# Patient Record
Sex: Female | Born: 1955 | Race: White | Hispanic: No | Marital: Married | Smoking: Never smoker
Health system: Southern US, Community
[De-identification: ages and names within clinical notes are randomized; demographics above are authoritative.]

## PROBLEM LIST (undated history)

## (undated) DIAGNOSIS — E663 Overweight: Secondary | ICD-10-CM

## (undated) HISTORY — DX: Overweight: E66.3

## (undated) HISTORY — PX: APPENDECTOMY: SHX54

---

## 2012-02-26 DEATH — deceased

## 2018-02-14 LAB — HM COLONOSCOPY

## 2019-09-09 LAB — LIPID PANEL
Cholesterol: 181 (ref 0–200)
HDL: 71 — AB (ref 35–70)
LDL Cholesterol: 80
LDl/HDL Ratio: 2.5
Triglycerides: 145 (ref 40–160)

## 2019-09-09 LAB — BASIC METABOLIC PANEL: Glucose: 82

## 2019-11-17 ENCOUNTER — Other Ambulatory Visit: Payer: Self-pay

## 2019-11-20 ENCOUNTER — Ambulatory Visit (INDEPENDENT_AMBULATORY_CARE_PROVIDER_SITE_OTHER): Payer: Commercial Managed Care - PPO | Admitting: Family Medicine

## 2019-11-20 ENCOUNTER — Other Ambulatory Visit: Payer: Self-pay

## 2019-11-20 ENCOUNTER — Encounter: Payer: Self-pay | Admitting: Family Medicine

## 2019-11-20 VITALS — BP 124/70 | HR 83 | Temp 97.5°F | Ht 67.0 in | Wt 183.4 lb

## 2019-11-20 DIAGNOSIS — E663 Overweight: Secondary | ICD-10-CM | POA: Diagnosis not present

## 2019-11-20 DIAGNOSIS — Z1231 Encounter for screening mammogram for malignant neoplasm of breast: Secondary | ICD-10-CM | POA: Diagnosis not present

## 2019-11-20 NOTE — Patient Instructions (Addendum)
Health Maintenance Due  Topic Date Due  . Hepatitis C Screening  Had in [place] will send for notes  [birth year]04/54  . HIV Screening  Had in [place] will send for notes  04/05/1969  . TETANUS/TDAP  Had in [place] will send for notes  04/05/1973  . PAP SMEAR-Modifier  [place] in [place] will send for notes  04/06/1975  . MAMMOGRAM  Had in [place] will send for notes  04/05/2004  . COLONOSCOPY  Had in [place] will send for notes  04/05/2004  . DEXA SCAN  Had in [place] will send for notes  04/06/2019  . PNA vac Low Risk Adult (1 of 2 - PCV13) Had in [place] will send for notes  04/06/2019   [facility]- and see where you stand in regards to shingles shot   I think setting a goal of 5-10 lbs off by physical would be reasonable by starting regular exercise  Recommended follow up: Return in about 10 months (around 09/19/2020) for physical or sooner if needed.

## 2019-11-20 NOTE — Progress Notes (Signed)
Phone: [phone]   Subjective:  Patient presents today to establish care.  Prior patient of Dr. Opal Sidles in [place].  Chief Complaint  Patient presents with  . New Patient (Initial Visit)  . Overweight   See problem oriented charting  The following were reviewed and entered/updated in epic:  Patient Active Problem List   Diagnosis Date Noted  . Overweight    Past Surgical History:  Procedure Laterality Date  . APPENDECTOMY    . CESAREAN SECTION      Family History  Problem Relation Age of Onset  . Leukemia Mother        died age 66- not sure if that was cause.   Marland Kitchen Heart disease Father        age 73.   Marland Kitchen Hypertension Sister   . Hypertension Brother     Medications- reviewed and updated No current outpatient medications on file.   No current facility-administered medications for this visit.     Allergies-reviewed and updated No Known Allergies  Social History   Social History Narrative   Married 1992. No children with current husband. 2 step children- Francis Gaines his kids. 2 children-Josselyn and Morrie Sheldon are his wife's kids. 7 grandkids total between kids and grandkids.    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   HENT: Negative.   Eyes: Negative.   Respiratory: Negative.   Cardiovascular: Negative.   Gastrointestinal: Negative.   Genitourinary: Negative.   Musculoskeletal: Negative.   Skin: Negative.   Neurological: Negative.   Endo/Heme/Allergies: Negative.   Psychiatric/Behavioral: Negative.    Objective  Objective:  BP 124/70 (BP Location: Right Arm, Cuff Size: Normal)   Pulse 83   Temp (!) 97.5 F (36.4 C)  (Temporal)   Ht 5\' 7"  (1.702 m)   Wt 183 lb 6.4 oz (83.2 kg)   SpO2 97%   BMI 28.72 kg/m  Gen: NAD, resting comfortably HEENT: Mask not removed due to covid 19. TM normal. Bridge of nose normal. Eyelids normal.  Neck: no thyromegaly or cervical lymphadenopathy  Neck: no thyromegaly, no cervical lymphadenopathy CV: RRR no murmurs rubs or gallops Lungs: CTAB no crackles, wheeze, rhonchi Abdomen: soft/nontender/nondistended/normal bowel sounds. No rebound or guarding.  Ext: no edema Skin: warm, dry Neuro: 5/5 strength in upper and lower extremities, normal gait, normal reflexes    Assessment and Plan:   # overweight S: trying to eat a reasonably healthy diet. Feels like not exercising like she should.   Wt Readings from Last 3 Encounters:  11/20/19 183 lb 6.4 oz (83.2 kg)  A/P: I think setting a goal of 5-10 lbs off by physical would be reasonable  -Encouraged need for healthy eating, regular exercise, weight loss.   #Health maintenance counseling-patient believes she is up-to-date on health maintenance other than 2018 last mammogram- get records but also go ahead and refer to the breast center to get her mammogram updated.  We reviewed importance of each item-we will try to get records Health Maintenance Due  Topic Date Due  . Hepatitis C Screening  Had in [place] will send for notes  10-Dec-1984  . HIV Screening  Had in  [place] will send for notes  04/06/1999  . TETANUS/TDAP  Had in [place] will send for notes  04/06/2003  . PAP SMEAR-Modifier  Had in [place] will send for notes  04/05/2005  . MAMMOGRAM  Had in [place] will send for notes  04/05/2034  . COLONOSCOPY  Had in [place] will send for notes  04/05/2034  . DEXA SCAN  Had in [place] will send for notes  04/05/2049  . PNA vac Low Risk Adult (1 of 2 - PCV13) Had in [place] will send for notes  04/05/2049    Recommended follow up: Return in about 10 months (around 09/19/2020) for physical or sooner if needed. Future Appointments  Date Time Provider [place]  09/17/2020  8:20 AM Marin Olp, MD [facility]    Time Stamp The duration of face-to-face time during this visit was greater than 20 minutes. Greater than 50% of this time was spent in counseling, explanation of diagnosis, planning of further management, and/or coordination of care including discussion of importance of healthy lifestyle choices, COVID-19 counseling, health maintenance needs and importance of obtaining records follow.    Return precautions advised. Garret Reddish, MD

## 2020-01-16 ENCOUNTER — Ambulatory Visit: Payer: Commercial Managed Care - PPO

## 2020-01-28 ENCOUNTER — Ambulatory Visit: Payer: Commercial Managed Care - PPO

## 2020-02-19 ENCOUNTER — Other Ambulatory Visit: Payer: Self-pay

## 2020-02-19 ENCOUNTER — Ambulatory Visit
Admission: RE | Admit: 2020-02-19 | Discharge: 2020-02-19 | Disposition: A | Discharge status: Home / Self Care | Payer: Medicare Other | Source: Ambulatory Visit | Attending: Family Medicine | Admitting: Family Medicine

## 2020-02-19 DIAGNOSIS — Z1231 Encounter for screening mammogram for malignant neoplasm of breast: Secondary | ICD-10-CM

## 2020-09-16 NOTE — Patient Instructions (Addendum)
Health Maintenance Due  Topic Date Due  . TETANUS/TDAP consider getting this at your pharmacy at least a month after flu shot Never done  . DEXA SCAN   Schedule your bone density test at check out desk. You may also call directly to X-ray at [phone] to schedule an appointment that is convenient for you.  - located [address] across the street from [facility] - in the basement - you do need an appointment for the bone density tests.    Never done  . PNeumovax 23 (pneumonia shot)- today Never done  . INFLUENZA VACCINE In office flu shot high dose 07/28/2020   Sign release of information at the check out desk for last colonoscopy  Keep an eye out for formal recommendations on covid 19 booster  Baseline ekg today  Please stop by lab before you go If you have mychart- we will send your results within 3 business days of [place] receiving them.  If you do not have mychart- we will call you about results within 5 business days of [place] receiving them.  *please note we are currently using [facility] which has a longer processing time than [facility] typically so labs may not come back as quickly as in the past *please also note that you will see labs on mychart as soon as they post. I will later go in and write notes on them- will say "notes from Dr. Durene Cal"    Rebekah Bell , Thank you for taking time to come for your Medicare Wellness Visit. I appreciate your ongoing commitment to your health goals. Please review the following plan we discussed and let me know if I can assist you in the future.   These are the goals we discussed: 1. Exercise 150 minutes per week   This is a list of the screening recommended for you and due dates:  Health Maintenance  Topic Date Due  .  Hepatitis C: One time screening is recommended by Center for Disease Control  (CDC) for  adults born from [birth year] through 1967.   Never done  . Tetanus Vaccine  Never done  . Colon Cancer Screening  Never done  . DEXA  scan (bone density measurement)  Never done  . Pneumonia vaccines (1 of 2 - PCV13) Never done  . Flu Shot  07/28/2020  . Mammogram  02/18/2022  . COVID-19 Vaccine  Completed

## 2020-09-16 NOTE — Progress Notes (Addendum)
Phone: [phone]   Subjective:  Patient presents today for their Welcome to Medicare Exam    Preventive Screening-Counseling & Management  Vision screen:   Hearing Screening   125Hz  250Hz  500Hz  1000Hz  2000Hz  3000Hz  4000Hz  6000Hz  8000Hz   Right ear:           Left ear:             Visual Acuity Screening   Right eye Left eye Both eyes  Without correction:     With correction: 20/30 20/30 20/25     Advanced directives: does not have HCPOA/living will. Full code discussed today.   Modifiable Risk Factors/behavioral risk assessment/psychosocial risk assessment Regular exercise: walking 2-3x a week for 2 miles.  Diet: feels she could improve on diet- more veggies/fruits and less processed food advised. Weight up 5 lbs and goal had been to lose some.   Wt Readings from Last 3 Encounters:  09/17/20 188 lb 3.2 oz (85.4 kg)  11/20/19 183 lb 6.4 oz (83.2 kg)  Smoking Status: Never Smoker Second Hand Smoking status: No smokers in home Alcohol intake: 4 per week Illegal drugs: none  Cardiac risk factors:  advanced age (older than 32 for men, 46 for women)  mild Hyperlipidemia - with newer guidelines LDL under 70 but 10 year ascvd risk under 7.5% so still not on statin.  no Hypertension  No diabetes. Will get fasting CBG to screen. No prior a1c.  Family History:  Father with heart disease age 107   Depression Screen/risk evaluation Risk factors: none. PHQ2 0  Depression screen [facility] 2/9 09/17/2020 11/20/2019  Decreased Interest 0 0  Down, Depressed, Hopeless 0 0  PHQ - 2 Score 0 0  Altered sleeping - 0  Tired, decreased energy - 0  Change in appetite - 0  Feeling bad or failure about yourself  - 0  Trouble concentrating - 0  Moving slowly or fidgety/restless - 0  Suicidal thoughts - 0  PHQ-9 Score - 0  Difficult doing work/chores - Not difficult at all   Functional ability and level of safety Mobility assessment:  timed get up and go <12 seconds Activities of Daily Living-  Independent in ADLs (toileting, bathing, dressing, transferring, eating) and in IADLs (shopping, housekeeping, managing own medications, and handling finances) Home Safety: Loose rugs (no), smoke detectors (up to date), small pets (yes- encouraged caution), grab bars (yes), stairs (one level home), life-alert system (would use cell phone) Hearing Difficulties: -patient declines Fall Risk: None  Fall Risk  09/17/2020 11/20/2019  Falls in the past year? 0 0  Number falls in past yr: 0 0  Injury with Fall? 0 0   Opioid use history:  no long term opioids use Self assessment of health status: "good"  Required Immunizations needed today:  Tdap at home pharmacy. High dose flu shot today. Pneumovax 23 today with flu shot.  Immunization History  Administered Date(s) Administered  . Influenza, High Dose Seasonal PF 09/09/2019  . Moderna SARS-COVID-2 Vaccination 01/24/2020, 02/21/2020  . Zoster Recombinat (Shingrix) 04/15/2019, 11/20/2019   Health Maintenance  Topic Date Due  .  Hepatitis C: One time screening is recommended by Center for Disease Control  (CDC) for  adults born from [birth year] through 1967.   Never done  . Tetanus Vaccine  Never done  . Colon Cancer Screening  Never done  . DEXA scan (bone density measurement)  Never done  . Pneumonia vaccines (1 of 2 - PCV13) Never done  . Flu Shot  07/28/2020  .  Mammogram  02/18/2022  . COVID-19 Vaccine  Completed    Screening tests-  Health Maintenance Due  Topic Date Due  . Hepatitis C Screening - today with labs Never done  . COLONOSCOPY - will try to get records- under 10 years since last and was told exam was normal Never done  . DEXA SCAN - ordered today Never done   1. Colon cancer screening-  Get records as above 2. Lung Cancer screening-  Not a candidate 3. Skin cancer screening-  Does not see dermatology 4. Cervical cancer screening- states never had abnormal pap smear. Passed age based screening recommnendations 5. Breast cancer  screening- mammogram 02/19/20 3d mammogram with 1 year repeat  The following were reviewed and entered/updated in epic: Past Medical History:  Diagnosis Date  . Mild hyperlipidemia 09/17/2020   LDL >70   . Overweight    Patient Active Problem List   Diagnosis Date Noted  . Mild hyperlipidemia 09/17/2020    Priority: Medium  . Overweight     Priority: Medium   Past Surgical History:  Procedure Laterality Date  . APPENDECTOMY    . CESAREAN SECTION      Family History  Problem Relation Age of Onset  . Leukemia Mother        died age 55- not sure if that was cause.   Marland Kitchen Heart disease Father        age 73.   Marland Kitchen Hypertension Sister   . Hypertension Brother    Medications- reviewed and updated No current outpatient medications on file.   No current facility-administered medications for this visit.   Allergies-reviewed and updated No Known Allergies  Social History   Socioeconomic History  . Marital status: Married    Spouse name: Jonny Ruiz  . Number of children: 2  . Years of education: Not on file  . Highest education level: Not on file  Occupational History  . Not on file  Tobacco Use  . Smoking status: Never Smoker  . Smokeless tobacco: Never Used  Substance and Sexual Activity  . Alcohol use: Yes    Comment: 2-3 per week  . Drug use: Never  . Sexual activity: Not Currently  Other Topics Concern  . Not on file  Social History Narrative   Married 1992. No children with current husband. 2 step children- Francis Gaines his kids. 2 children-Yvette and Morrie Sheldon are his wife's kids. 7 grandkids total between kids and grandkids.    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 Difficulty of Paying Living Expenses: Not hard at all  Food Insecurity: No Food Insecurity  . Worried About Programme researcher, broadcasting/film/video in the Last Year: Never true  . Ran Out of Food in the Last Year: Never true  Transportation Needs: No Transportation Needs  . Lack of Transportation (Medical): No  . Lack of Transportation (Non-Medical): No  Physical Activity: Insufficiently Active  . Days of Exercise per Week: 2 days  . Minutes of Exercise per Session: 30 min  Stress: No Stress Concern Present  . Feeling of Stress : Not at all  Social Connections: Moderately Integrated  .  Frequency of Communication with Friends and Family: Twice a week  . Frequency of Social Gatherings with Friends and Family: Once a week  . Attends Religious Services: More than 4 times per year  . Active Member of Clubs or Organizations: No  . Attends Banker Meetings: Never  . Marital Status: Married   Objective  Objective:  BP 122/80   Pulse 82   Temp 97.6 F (36.4 C) (Temporal)   Resp 18   Ht 5\' 7"  (1.702 m)   Wt 188 lb 3.2 oz (85.4 kg)   SpO2 97%   BMI 29.48 kg/m  Gen: NAD, resting comfortably HEENT: Mucous membranes are moist. Oropharynx normal Neck: no thyromegaly CV: RRR no murmurs rubs or gallops Lungs: CTAB no crackles, wheeze, rhonchi Abdomen: soft/nontender/nondistended/normal bowel sounds. No rebound or guarding.  Ext: no edema Skin: warm, dry Neuro: grossly normal, moves all extremities, PERRLA  EKG: sinus rhythm with rate 79- 3 PVCs noted, normal axis, normal intervals, possible LVH on voltage criteria RII) and s(III), no st or t wave changes    Assessment and Plan:   Welcome to Medicare exam completed-  1. Educated, counseled and referred based on above elements 2. Educated, counseled and referred as appropriate for preventative needs 3. Discussed and documented a written plan for preventiative services and screenings with personalized health advice- After  Visit Summary was given to patient which included this plan  4. EKG offered - patient opts in  Status of chronic or acute concerns   Overweight- discussed reversing this  Mild hyperlipidemia- update labs and encouraged lifestyle changes  Recommended follow up: 1 year awv   Lab/Order associations:   ICD-10-CM   1. Preventative health care  Z00.00 Hepatitis C antibody    COMPLETE METABOLIC PANEL WITH GFR    CBC    Lipid panel  2. Screening for colon cancer  Z12.11   3. Screening exam for skin cancer  Z12.83 Ambulatory referral to Dermatology  4. Postmenopausal  Z78.0 DG Bone Density  5. Mild hyperlipidemia  E78.5 COMPLETE METABOLIC PANEL WITH GFR    CBC    Lipid panel  6. Encounter for hepatitis C screening test for low risk patient  Z11.59 Hepatitis C antibody   Return precautions advised. 02-17-1981, MD

## 2020-09-17 ENCOUNTER — Other Ambulatory Visit: Payer: Self-pay

## 2020-09-17 ENCOUNTER — Encounter: Payer: Self-pay | Admitting: Family Medicine

## 2020-09-17 ENCOUNTER — Ambulatory Visit (INDEPENDENT_AMBULATORY_CARE_PROVIDER_SITE_OTHER): Payer: Medicare Other | Admitting: Family Medicine

## 2020-09-17 VITALS — BP 122/80 | HR 82 | Temp 97.6°F | Resp 18 | Ht 67.0 in | Wt 188.2 lb

## 2020-09-17 DIAGNOSIS — Z1159 Encounter for screening for other viral diseases: Secondary | ICD-10-CM

## 2020-09-17 DIAGNOSIS — Z23 Encounter for immunization: Secondary | ICD-10-CM | POA: Diagnosis not present

## 2020-09-17 DIAGNOSIS — Z0000 Encounter for general adult medical examination without abnormal findings: Secondary | ICD-10-CM

## 2020-09-17 DIAGNOSIS — E785 Hyperlipidemia, unspecified: Secondary | ICD-10-CM

## 2020-09-17 DIAGNOSIS — Z1283 Encounter for screening for malignant neoplasm of skin: Secondary | ICD-10-CM

## 2020-09-17 DIAGNOSIS — Z1211 Encounter for screening for malignant neoplasm of colon: Secondary | ICD-10-CM | POA: Diagnosis not present

## 2020-09-17 DIAGNOSIS — Z780 Asymptomatic menopausal state: Secondary | ICD-10-CM

## 2020-09-17 HISTORY — DX: Hyperlipidemia, unspecified: E78.5

## 2020-09-17 NOTE — Addendum Note (Signed)
Addended by: Manuela Schwartz on: 09/17/2020 09:53 AM   Modules accepted: Orders

## 2020-09-18 LAB — CBC
HCT: 42.1 % (ref 35.0–45.0)
Hemoglobin: 13.5 g/dL (ref 11.7–15.5)
MCH: 30.1 pg (ref 27.0–33.0)
MCHC: 32.1 g/dL (ref 32.0–36.0)
MCV: 93.8 fL (ref 80.0–100.0)
MPV: 11.3 fL (ref 7.5–12.5)
Platelets: 216 10*3/uL (ref 140–400)
RBC: 4.49 10*6/uL (ref 3.80–5.10)
RDW: 12.5 % (ref 11.0–15.0)
WBC: 5.6 10*3/uL (ref 3.8–10.8)

## 2020-09-18 LAB — COMPLETE METABOLIC PANEL WITH GFR
AG Ratio: 1.9 (calc) (ref 1.0–2.5)
ALT: 23 U/L (ref 6–29)
AST: 21 U/L (ref 10–35)
Albumin: 4.4 g/dL (ref 3.6–5.1)
Alkaline phosphatase (APISO): 58 U/L (ref 37–153)
BUN: 17 mg/dL (ref 7–25)
CO2: 29 mmol/L (ref 20–32)
Calcium: 9.2 mg/dL (ref 8.6–10.4)
Chloride: 101 mmol/L (ref 98–110)
Creat: 0.89 mg/dL (ref 0.50–0.99)
GFR, Est African American: 78 mL/min/{1.73_m2} (ref >=60)
GFR, Est Non African American: 68 mL/min/{1.73_m2} (ref >=60)
Globulin: 2.3 g/dL (calc) (ref 1.9–3.7)
Glucose, Bld: 92 mg/dL (ref 65–99)
Potassium: 4.4 mmol/L (ref 3.5–5.3)
Sodium: 139 mmol/L (ref 135–146)
Total Bilirubin: 0.7 mg/dL (ref 0.2–1.2)
Total Protein: 6.7 g/dL (ref 6.1–8.1)

## 2020-09-18 LAB — LIPID PANEL
Cholesterol: 194 mg/dL (ref <200)
HDL: 68 mg/dL (ref >=50)
LDL Cholesterol (Calc): 105 mg/dL (calc) — ABNORMAL HIGH
Non-HDL Cholesterol (Calc): 126 mg/dL (calc) (ref <130)
Total CHOL/HDL Ratio: 2.9 (calc) (ref <5.0)
Triglycerides: 116 mg/dL (ref <150)

## 2020-09-18 LAB — HEPATITIS C ANTIBODY
Hepatitis C Ab: NONREACTIVE
SIGNAL TO CUT-OFF: 0.01 (ref <1.00)

## 2020-09-24 ENCOUNTER — Other Ambulatory Visit: Payer: Self-pay

## 2020-09-24 ENCOUNTER — Ambulatory Visit (INDEPENDENT_AMBULATORY_CARE_PROVIDER_SITE_OTHER)
Admission: RE | Admit: 2020-09-24 | Discharge: 2020-09-24 | Disposition: A | Discharge status: Home / Self Care | Payer: Medicare Other | Source: Ambulatory Visit | Attending: Family Medicine | Admitting: Family Medicine

## 2020-09-24 DIAGNOSIS — Z780 Asymptomatic menopausal state: Secondary | ICD-10-CM

## 2021-02-14 ENCOUNTER — Ambulatory Visit: Payer: Medicare Other | Admitting: Physician Assistant

## 2021-03-20 ENCOUNTER — Encounter: Payer: Self-pay | Admitting: Physician Assistant

## 2021-03-20 ENCOUNTER — Ambulatory Visit (INDEPENDENT_AMBULATORY_CARE_PROVIDER_SITE_OTHER): Payer: Medicare Other | Admitting: Physician Assistant

## 2021-03-20 ENCOUNTER — Other Ambulatory Visit: Payer: Self-pay

## 2021-03-20 DIAGNOSIS — L821 Other seborrheic keratosis: Secondary | ICD-10-CM

## 2021-03-20 DIAGNOSIS — L720 Epidermal cyst: Secondary | ICD-10-CM | POA: Diagnosis not present

## 2021-03-20 DIAGNOSIS — L719 Rosacea, unspecified: Secondary | ICD-10-CM

## 2021-03-20 DIAGNOSIS — Z1283 Encounter for screening for malignant neoplasm of skin: Secondary | ICD-10-CM

## 2021-03-20 DIAGNOSIS — L820 Inflamed seborrheic keratosis: Secondary | ICD-10-CM | POA: Diagnosis not present

## 2021-04-07 ENCOUNTER — Encounter: Payer: Self-pay | Admitting: Physician Assistant

## 2021-04-07 NOTE — Progress Notes (Signed)
   New Patient   Subjective  Rebekah Bell is a 65 y.o. female who presents for the following: Annual Exam (Right shoulder tan spot, left shin blue spot never seen derm before).   The following portions of the chart were reviewed this encounter and updated as appropriate:  Tobacco  Allergies  Meds  Problems  Med Hx  Surg Hx  Fam Hx      Objective  Well appearing patient in no apparent distress; mood and affect are within normal limits.  A full examination was performed including scalp, head, eyes, ears, nose, lips, neck, chest, axillae, abdomen, back, buttocks, bilateral upper extremities, bilateral lower extremities, hands, feet, fingers, toes, fingernails, and toenails. All findings within normal limits unless otherwise noted below.  Objective  Left Abdomen (side) - Lower: Full body skin check. No atypical moles, no skin cancer  Objective  Left Buccal Cheek, Left Thigh - Anterior (2), Right Shoulder - Anterior: waxy brown, black, or tan growth.  Objective  Left Lower Eyelid: tiny white cyst   Objective  Left Root of Nose: Erythematous stuck-on crusty plaque. A benign skin growth that has become irritated over time   Assessment & Plan  Screening exam for skin cancer Left Abdomen (side) - Lower  Yearly skin check.  Seborrheic keratosis (4) Right Shoulder - Anterior; Left Thigh - Anterior (2); Left Buccal Cheek  Ok to leave if stable  [place] Left Lower Eyelid  Ok to leave if stable  Inflamed seborrheic keratosis Left Root of Nose  Destruction of lesion - Left Root of Nose Complexity: simple   Destruction method: cryotherapy   Informed consent: discussed and consent obtained   Timeout:  patient name, date of birth, surgical site, and procedure verified Lesion destroyed using liquid nitrogen: Yes   Cryotherapy cycles:  1 Outcome: patient tolerated procedure well with no complications   Post-procedure details: wound care instructions given      I,  Ginamarie Banfield, PA-C, have reviewed all documentation for this visit. The documentation on 04/07/21 for the exam, diagnosis, procedures, and orders are all accurate and complete.

## 2021-06-13 ENCOUNTER — Encounter: Payer: Self-pay | Admitting: Family Medicine

## 2021-09-11 ENCOUNTER — Telehealth: Payer: Self-pay | Admitting: Family Medicine

## 2021-09-11 NOTE — Telephone Encounter (Signed)
Copied from CRM [id number]. Topic: Medicare AWV >> Sep 11, 2021  9:43 AM Harris-Coley, [place] Gully wrote: Reason for CRM: Left message for patient to schedule Annual Wellness Visit.  Please schedule with Nurse Health Advisor Lanier Ensign, RN at [facility].  Please call [phone] ask for [facility]

## 2021-09-11 NOTE — Telephone Encounter (Signed)
Patient declined apt.

## 2021-09-15 NOTE — Progress Notes (Signed)
Phone [phone]   Subjective:  Patient presents today for their annual follow up of hyperlipidemia. Chief complaint-noted.   See problem oriented charting- ROS- full  review of systems was completed and negative per full ROS sheet  The following were reviewed and entered/updated in epic: Past Medical History:  Diagnosis Date   Mild hyperlipidemia 09/17/2020   LDL >70    Overweight    Patient Active Problem List   Diagnosis Date Noted   Mild hyperlipidemia 09/17/2020    Priority: Medium   Overweight     Priority: Medium   Past Surgical History:  Procedure Laterality Date   APPENDECTOMY     CESAREAN SECTION      Family History  Problem Relation Age of Onset   Leukemia Mother        died age 58- not sure if that was cause.    Heart disease Father        age 90.    Hypertension Sister    Hypertension Brother     Medications- reviewed and updated No current outpatient medications on file.   No current facility-administered medications for this visit.    Allergies-reviewed and updated No Known Allergies  Social History   Social History Narrative   Married 1992. No children with current husband. 2 step children- Francis Gaines his kids. 2 children-Aaira and Morrie Sheldon are his wife's kids. 7 grandkids total between kids and grandkids.    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 Oropharynx normal Neck: no thyromegaly CV: RRR no murmurs rubs or gallops Lungs: CTAB no crackles, wheeze,  rhonchi Abdomen: soft/nontender/nondistended/normal bowel sounds. No rebound or guarding.  Ext: no edema Skin: warm, dry Neuro: grossly normal, moves all extremities, PERRLA   Assessment and Plan   65 y.o. female presenting for follow-up and health maintenance counseling 1. Anticipatory guidance: Patient counseled regarding regular dental exams -q6 months, eye exams -yearly,  avoiding smoking and second hand smoke , limiting alcohol to 1 beverage per day- 4 max per week .  No illicit drugs 2. Risk factor reduction:  Advised patient of need for regular exercise and diet rich and fruits and vegetables to reduce risk of heart attack and stroke. Exercise- more active since starting back at work- doing some walking outside of this. Diet-down 4 lbs from last year- making gradual changes.  Wt Readings from Last 3 Encounters:  09/19/21 184 lb 3.2 oz (83.6 kg)  09/17/20 188 lb 3.2 oz (85.4 kg)  11/20/19 183 lb 6.4 oz (83.2 kg)  3. Immunizations/screenings/ancillary studies DISCUSSED:  -TDAP vaccination #1 - discussed at pharmacy -COVID-19 vaccination #2 repeat planned - will send 11/22/19 dates plus recommended omicron booster -Flu vaccination (last one 09/21) - high dose flu shot today Immunization History  Administered Date(s) Administered   Fluad Quad(high Dose 65+) 09/17/2020   Influenza, High Dose Seasonal PF 09/09/2019   Moderna Sars-Covid-2 Vaccination 01/24/2020, 02/21/2020  Pneumococcal Polysaccharide-23 09/17/2020   Zoster Recombinat (Shingrix) 04/15/2019, 11/20/2019  4. Cervical cancer screening- past aged based screening recommendations and never had abnormal Pap smear- no further pap needed. No discharge or vaginal bleeding. No gynecologist 5. Breast cancer screening-  breast exam - prefers self exams- and mammogram on 02/19/2020 - ordered today- she prefers 3d 6. Colon cancer screening - colonoscopy completed 02/14/2018 - she was told 3 year repeat potentially but we still do not have  records so cannot refer until we have records- will try again-  7. Skin cancer screening - saw derm in last year. advised regular sunscreen use. Denies worrisome, changing, or new skin lesions.  8. Birth control/STD check- postmenopausal and monogamous  9. Osteoporosis screening at 65-DEXA on 09/24/2020 excellent -Never smoker  Status of chronic or acute concerns   #Overweight   S:tried to eat a reasonably healthy diet. Felt like not exercising like she should last year- more active with work now and [employer] Readings from Last 3 Encounters:  09/19/21 184 lb 3.2 oz (83.6 kg)  09/17/20 188 lb 3.2 oz (85.4 kg)  11/20/19 183 lb 6.4 oz (83.2 kg)  A/P: improving- -Encouraged need for continued healthy eating, regular exercise (recommended outside of work as well), weight loss.   #hyperlipidemia S: Medication: None-encouraged lifestyle changes  - lipids 09/21: slightly elevated but not in range where we needed to start cholesterol medicine  Lab Results  Component Value Date   CHOL 194 09/17/2020   HDL 68 09/17/2020   LDLCALC 105 (H) 09/17/2020   TRIG 116 09/17/2020   CHOLHDL 2.9 09/17/2020   A/P: Based on last year's labs and current blood pressure her 10-year risk of heart attack or stroke is 5.2%-we will recalculate when we get updated lipid panel today.  Has had some mild weight loss which is excellent and she will work to continue this.  If risk  stays above 5% consider coronary artery calcium scoring due to dad's history  Recommended follow up: Return in about 1 year (around 09/19/2022) for follow up- or sooner if needed.  Lab/Order associations: fasting   ICD-10-CM   1. Mild hyperlipidemia  E78.5 CBC with Differential/Platelet    Comprehensive metabolic panel    Lipid panel    2. Overweight  E66.3     3. Encounter for screening mammogram for malignant neoplasm of breast  Z12.31 MM 3D SCREEN BREAST BILATERAL     Time Spent: 20 minutes of total time (8:21 AM- 8:41 AM) was  spent on the date of the encounter performing the following actions: chart review prior to seeing the patient, obtaining history, performing a medically necessary exam, counseling on the treatment plan, placing orders, and documenting in our EHR.   I,Jada Bradford,acting as a scribe for Tana Conch, MD.,have documented all relevant documentation on the behalf of Tana Conch, MD,as directed by  Tana Conch, MD while in the presence of Tana Conch, MD.  I, Tana Conch, MD, have reviewed all documentation for this visit. The documentation on 09/19/21 for the exam, diagnosis, procedures, and orders are all accurate and complete.   Return precautions advised.  Tana Conch, MD

## 2021-09-19 ENCOUNTER — Other Ambulatory Visit: Payer: Self-pay

## 2021-09-19 ENCOUNTER — Encounter: Payer: Self-pay | Admitting: Family Medicine

## 2021-09-19 ENCOUNTER — Ambulatory Visit (INDEPENDENT_AMBULATORY_CARE_PROVIDER_SITE_OTHER): Payer: Medicare Other | Admitting: Family Medicine

## 2021-09-19 VITALS — BP 115/72 | HR 70 | Temp 97.7°F | Ht 67.0 in | Wt 184.2 lb

## 2021-09-19 DIAGNOSIS — E785 Hyperlipidemia, unspecified: Secondary | ICD-10-CM

## 2021-09-19 DIAGNOSIS — Z23 Encounter for immunization: Secondary | ICD-10-CM

## 2021-09-19 DIAGNOSIS — Z0000 Encounter for general adult medical examination without abnormal findings: Secondary | ICD-10-CM

## 2021-09-19 DIAGNOSIS — E663 Overweight: Secondary | ICD-10-CM | POA: Diagnosis not present

## 2021-09-19 DIAGNOSIS — Z1231 Encounter for screening mammogram for malignant neoplasm of breast: Secondary | ICD-10-CM

## 2021-09-19 LAB — COMPREHENSIVE METABOLIC PANEL
ALT: 22 U/L (ref 0–35)
AST: 23 U/L (ref 0–37)
Albumin: 4.4 g/dL (ref 3.5–5.2)
Alkaline Phosphatase: 61 U/L (ref 39–117)
BUN: 16 mg/dL (ref 6–23)
CO2: 30 mEq/L (ref 19–32)
Calcium: 9.1 mg/dL (ref 8.4–10.5)
Chloride: 102 mEq/L (ref 96–112)
Creatinine, Ser: 0.8 mg/dL (ref 0.40–1.20)
GFR: 76.22 mL/min (ref >60.00)
Glucose, Bld: 91 mg/dL (ref 70–99)
Potassium: 4.2 mEq/L (ref 3.5–5.1)
Sodium: 137 mEq/L (ref 135–145)
Total Bilirubin: 0.5 mg/dL (ref 0.2–1.2)
Total Protein: 6.7 g/dL (ref 6.0–8.3)

## 2021-09-19 LAB — LIPID PANEL
Cholesterol: 187 mg/dL (ref 0–200)
HDL: 70.2 mg/dL (ref >39.00)
LDL Cholesterol: 106 mg/dL — ABNORMAL HIGH (ref 0–99)
NonHDL: 116.62
Total CHOL/HDL Ratio: 3
Triglycerides: 53 mg/dL (ref 0.0–149.0)
VLDL: 10.6 mg/dL (ref 0.0–40.0)

## 2021-09-19 LAB — CBC WITH DIFFERENTIAL/PLATELET
Basophils Absolute: 0 10*3/uL (ref 0.0–0.1)
Basophils Relative: 0.8 % (ref 0.0–3.0)
Eosinophils Absolute: 0.1 10*3/uL (ref 0.0–0.7)
Eosinophils Relative: 2.9 % (ref 0.0–5.0)
HCT: 39.4 % (ref 36.0–46.0)
Hemoglobin: 13.1 g/dL (ref 12.0–15.0)
Lymphocytes Relative: 27.4 % (ref 12.0–46.0)
Lymphs Abs: 1.1 10*3/uL (ref 0.7–4.0)
MCHC: 33.2 g/dL (ref 30.0–36.0)
MCV: 92.3 fl (ref 78.0–100.0)
Monocytes Absolute: 0.2 10*3/uL (ref 0.1–1.0)
Monocytes Relative: 6.2 % (ref 3.0–12.0)
Neutro Abs: 2.5 10*3/uL (ref 1.4–7.7)
Neutrophils Relative %: 62.7 % (ref 43.0–77.0)
Platelets: 212 10*3/uL (ref 150.0–400.0)
RBC: 4.27 Mil/uL (ref 3.87–5.11)
RDW: 13.4 % (ref 11.5–15.5)
WBC: 4 10*3/uL (ref 4.0–10.5)

## 2021-09-19 NOTE — Patient Instructions (Addendum)
Health Maintenance Due  Topic Date Due   TETANUS/TDAP Patient states that she will get this done at her local pharmacy.  Never done   COVID-19 Vaccine (3 - Moderna risk series) Patient will update [place] with dates via Mychart.  03/20/2020   INFLUENZA VACCINE Done today in office. High dose 07/28/2021   Order is in- please call to schedule Breast Center- [facility] [place] Schedule an appointment by calling [phone].  Sign release of information at the check out desk for last colonoscopy- please check back with our team within a month to make sure we got this.    Please stop by lab before you go If you have mychart- we will send your results within 3 business days of [place] receiving them.  If you do not have mychart- we will call you about results within 5 business days of [place] receiving them.  *please also note that you will see labs on mychart as soon as they post. I will later go in and write notes on them- will say "notes from Dr. Durene Cal"   Recommended follow up: No follow-ups on file.

## 2021-09-24 ENCOUNTER — Other Ambulatory Visit: Payer: Self-pay | Admitting: Family Medicine

## 2021-09-24 DIAGNOSIS — Z1231 Encounter for screening mammogram for malignant neoplasm of breast: Secondary | ICD-10-CM

## 2021-10-21 ENCOUNTER — Other Ambulatory Visit: Payer: Self-pay

## 2021-10-21 ENCOUNTER — Ambulatory Visit
Admission: RE | Admit: 2021-10-21 | Discharge: 2021-10-21 | Disposition: A | Discharge status: Home / Self Care | Payer: Medicare Other | Source: Ambulatory Visit

## 2021-10-21 DIAGNOSIS — Z1231 Encounter for screening mammogram for malignant neoplasm of breast: Secondary | ICD-10-CM

## 2021-11-13 ENCOUNTER — Telehealth: Payer: Self-pay | Admitting: Family Medicine

## 2021-11-13 NOTE — Telephone Encounter (Signed)
Copied from CRM [id number]. Topic: Medicare AWV >> Nov 13, 2021 10:25 AM Harris-Coley, Avon Gully wrote: Reason for CRM: Left message for patient to schedule Annual Wellness Visit.  Please schedule with Nurse Health Advisor Lanier Ensign, RN at [facility].  Please call [phone] ask for [facility]

## 2022-05-21 ENCOUNTER — Telehealth: Payer: Self-pay | Admitting: Family Medicine

## 2022-05-21 NOTE — Telephone Encounter (Signed)
Copied from [place] [id number]. Topic: Medicare AWV >> May 21, 2022 11:35 AM Harris-Coley, Hannah Beat wrote: Reason for CRM: Left message for patient to schedule Annual Wellness Visit.  Please schedule with Nurse Health Advisor Charlott Rakes, RN at [facility].  Please call [phone] ask for [facility]

## 2022-07-29 IMAGING — MG MM DIGITAL SCREENING BILAT W/ TOMO AND CAD
6 of 12 series · 6 of 36 positions shown · non-contrast
Comparison: Previous exam(s).

CLINICAL DATA: Screening.

EXAM:
DIGITAL SCREENING BILATERAL MAMMOGRAM WITH TOMOSYNTHESIS AND CAD
TECHNIQUE: Bilateral screening digital craniocaudal and mediolateral oblique
mammograms were obtained. Bilateral screening digital breast
tomosynthesis was performed. The images were evaluated with
computer-aided detection.

[R CC synth-2D (1 of 2)]
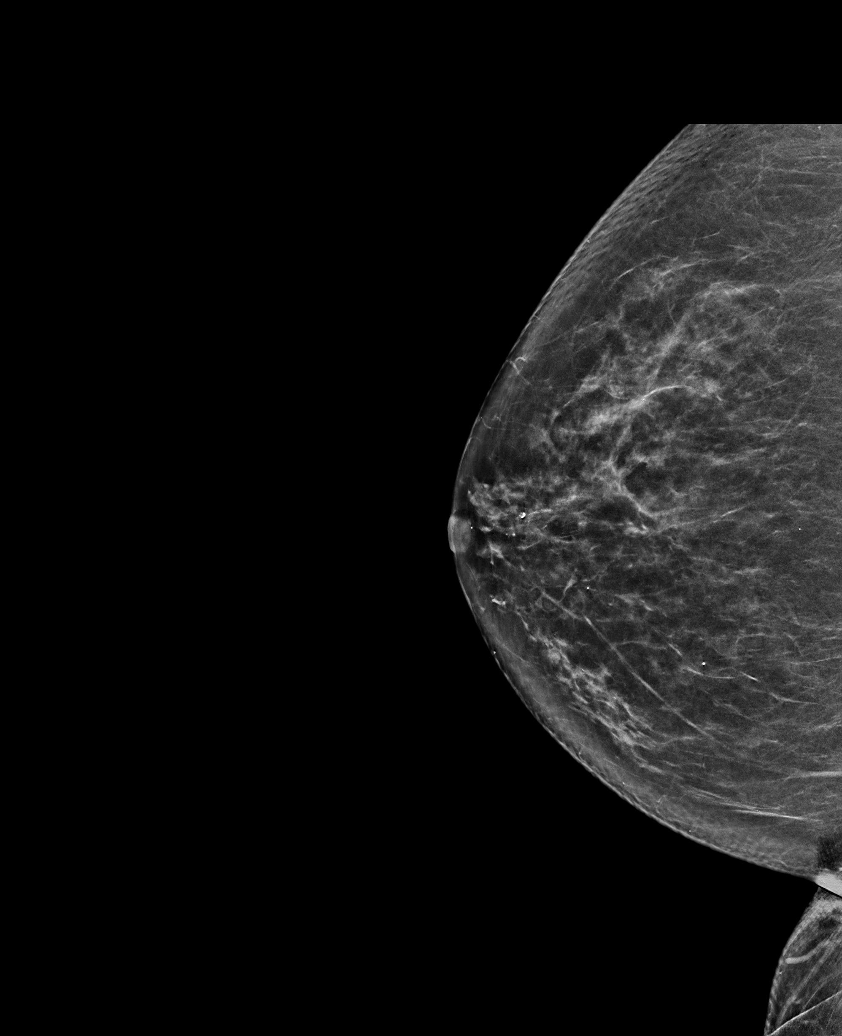

[L MLO synth-2D (1 of 2)]
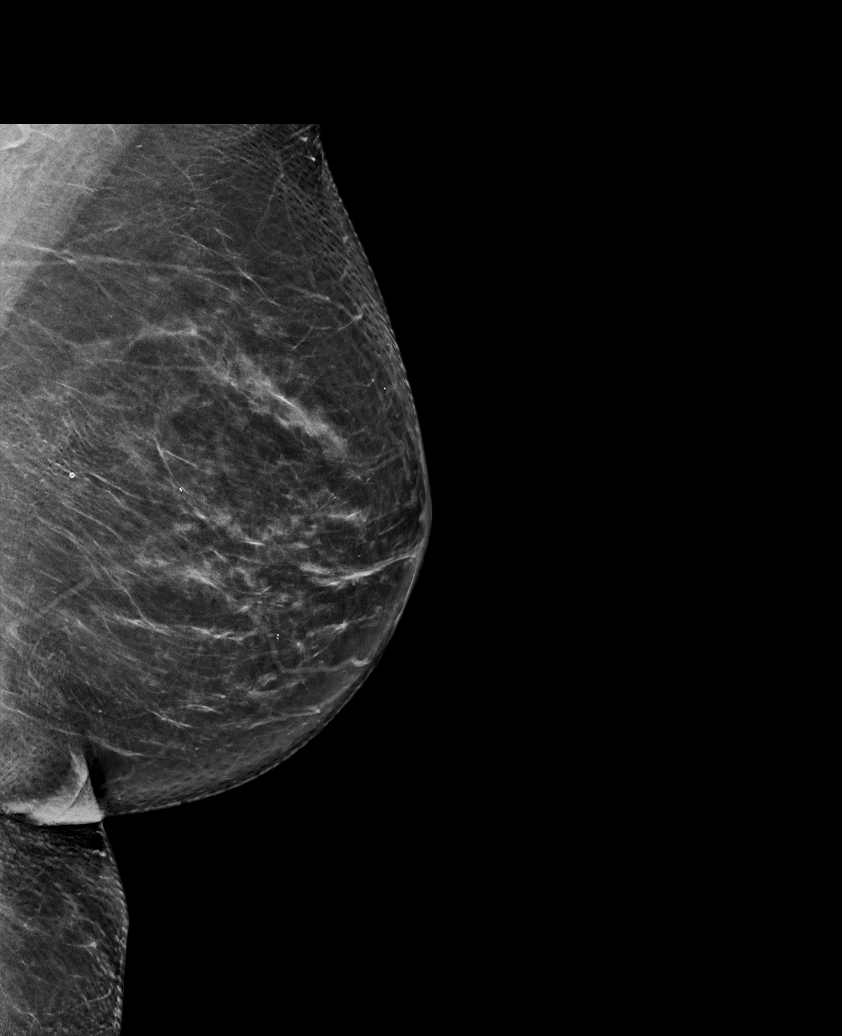

[R MLO synth-2D]
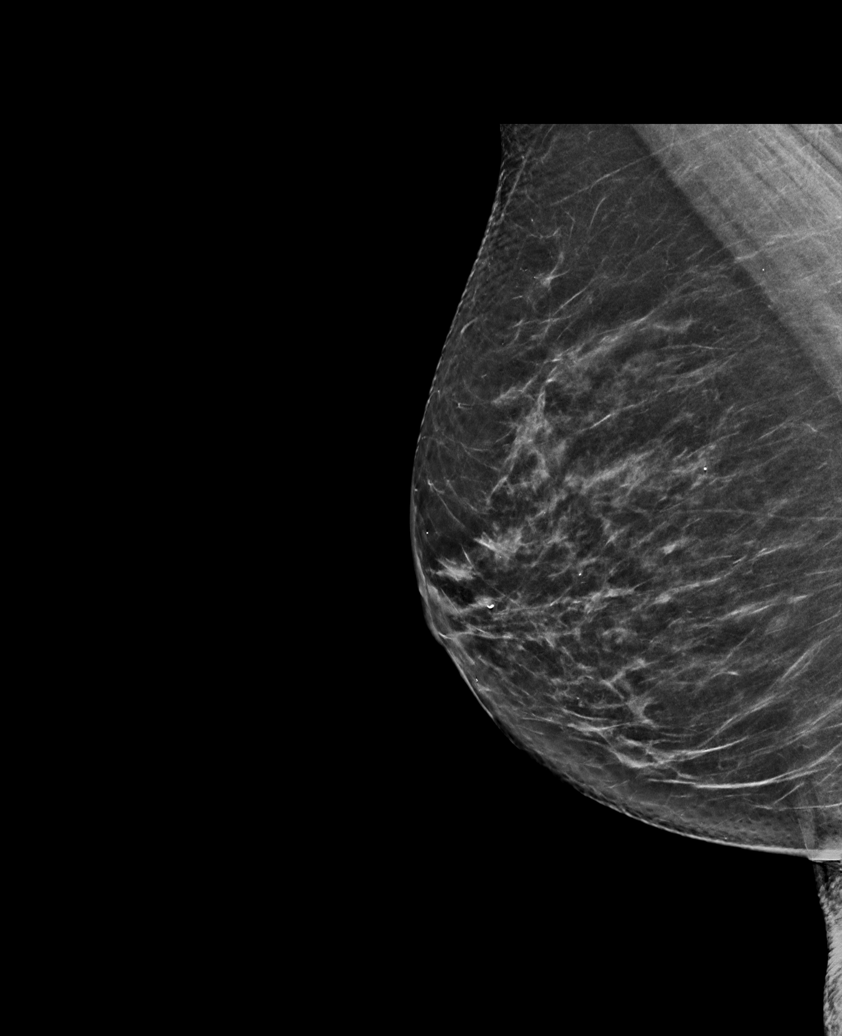

[L CC synth-2D]
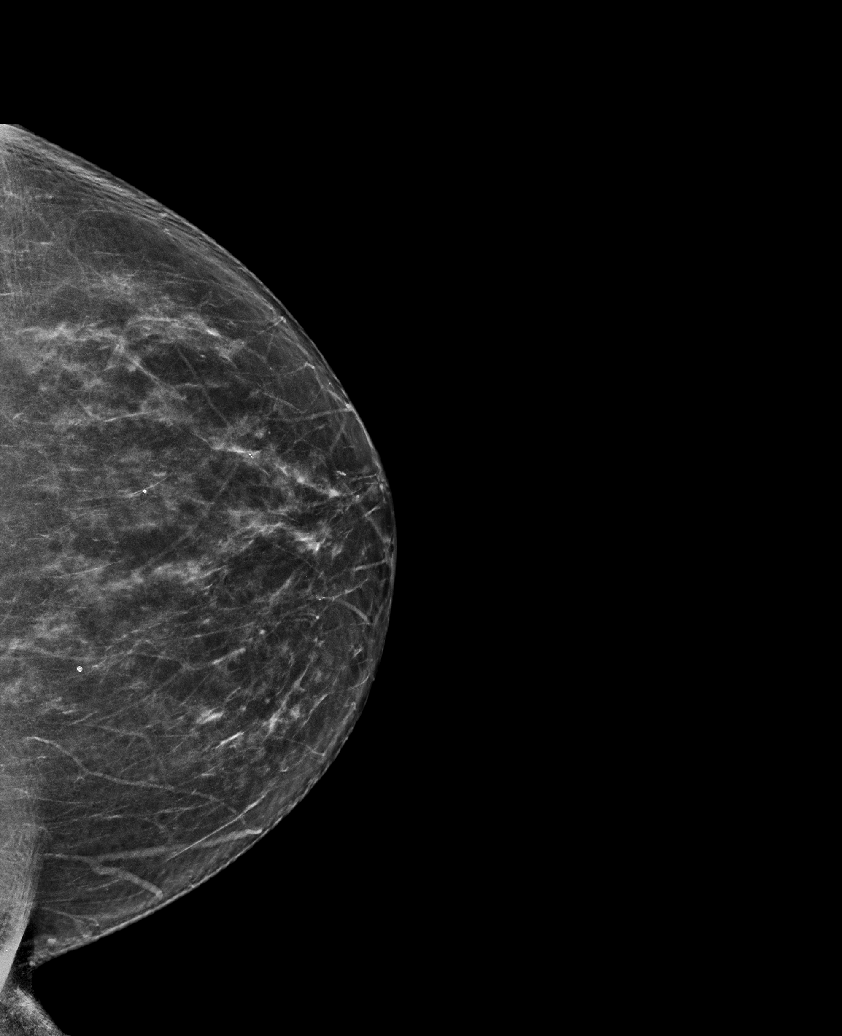

[R CC synth-2D (2 of 2)]
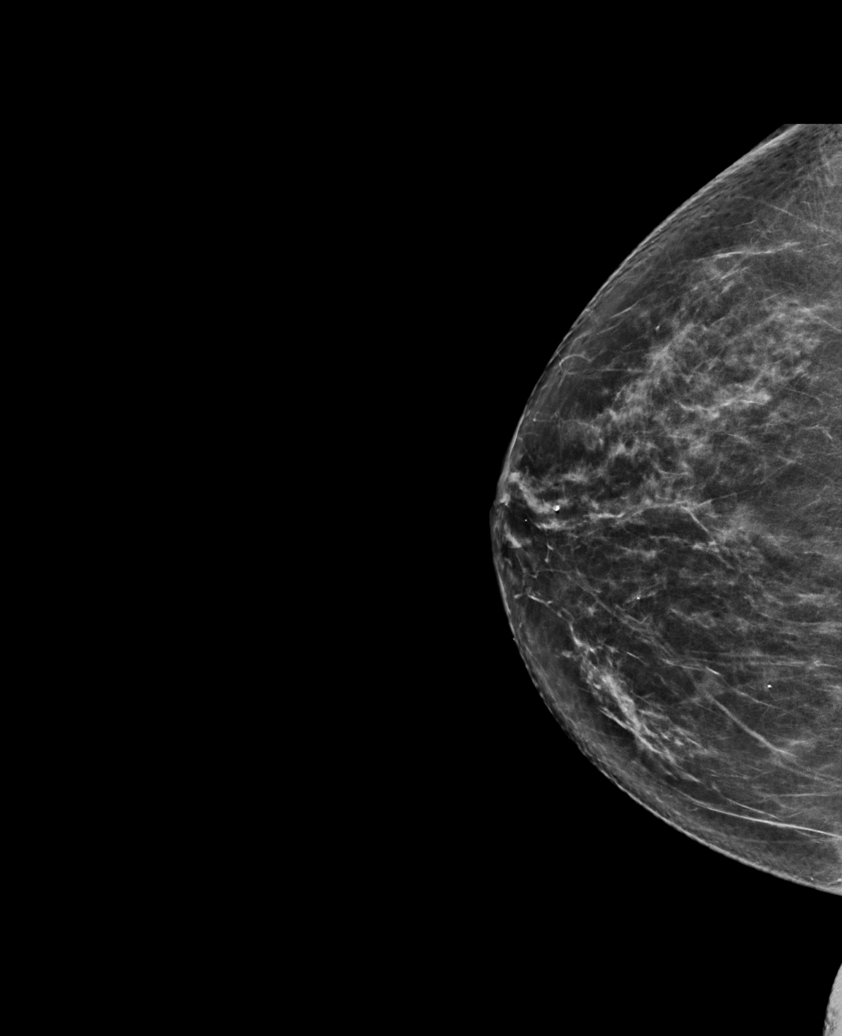

[L MLO synth-2D (2 of 2)]
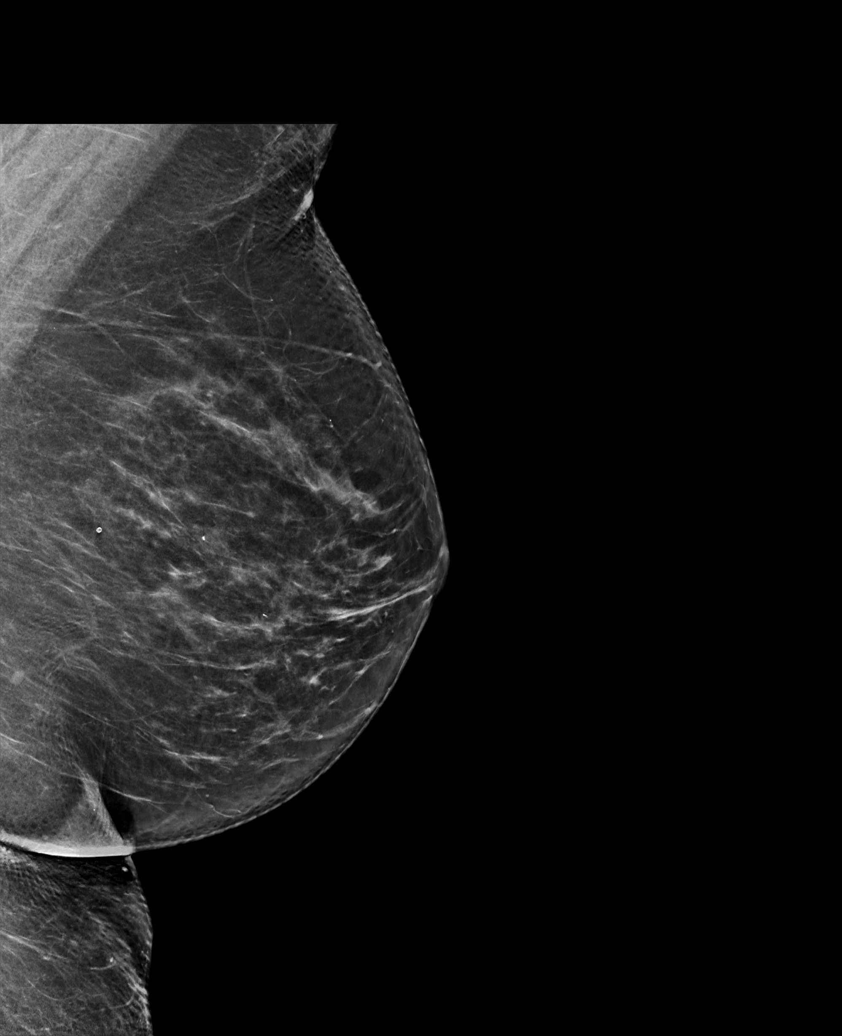

[6 of 36 positions shown; findings below may reference images not displayed]

ACR Breast Density Category b: There are scattered areas of
fibroglandular density.
FINDINGS: There are no findings suspicious for malignancy.
IMPRESSION: No mammographic evidence of malignancy. A result letter of this
screening mammogram will be mailed directly to the patient.

RECOMMENDATION:
Screening mammogram in one year. (Code:51-O-LD2)

BI-RADS CATEGORY  1: Negative.

## 2022-09-21 ENCOUNTER — Ambulatory Visit (INDEPENDENT_AMBULATORY_CARE_PROVIDER_SITE_OTHER): Payer: Medicare Other | Admitting: Family Medicine

## 2022-09-21 ENCOUNTER — Encounter: Payer: Self-pay | Admitting: Family Medicine

## 2022-09-21 VITALS — BP 132/80 | HR 71 | Temp 97.7°F | Ht 67.0 in | Wt 177.2 lb

## 2022-09-21 DIAGNOSIS — Z0000 Encounter for general adult medical examination without abnormal findings: Secondary | ICD-10-CM

## 2022-09-21 DIAGNOSIS — Z1211 Encounter for screening for malignant neoplasm of colon: Secondary | ICD-10-CM | POA: Diagnosis not present

## 2022-09-21 DIAGNOSIS — E785 Hyperlipidemia, unspecified: Secondary | ICD-10-CM

## 2022-09-21 LAB — CBC WITH DIFFERENTIAL/PLATELET
Basophils Absolute: 0 10*3/uL (ref 0.0–0.1)
Basophils Relative: 0.5 % (ref 0.0–3.0)
Eosinophils Absolute: 0.2 10*3/uL (ref 0.0–0.7)
Eosinophils Relative: 3 % (ref 0.0–5.0)
HCT: 38.8 % (ref 36.0–46.0)
Hemoglobin: 13.1 g/dL (ref 12.0–15.0)
Lymphocytes Relative: 21.8 % (ref 12.0–46.0)
Lymphs Abs: 1.3 10*3/uL (ref 0.7–4.0)
MCHC: 33.7 g/dL (ref 30.0–36.0)
MCV: 92.8 fl (ref 78.0–100.0)
Monocytes Absolute: 0.3 10*3/uL (ref 0.1–1.0)
Monocytes Relative: 5.7 % (ref 3.0–12.0)
Neutro Abs: 4.1 10*3/uL (ref 1.4–7.7)
Neutrophils Relative %: 69 % (ref 43.0–77.0)
Platelets: 200 10*3/uL (ref 150.0–400.0)
RBC: 4.18 Mil/uL (ref 3.87–5.11)
RDW: 13.1 % (ref 11.5–15.5)
WBC: 6 10*3/uL (ref 4.0–10.5)

## 2022-09-21 LAB — COMPREHENSIVE METABOLIC PANEL
ALT: 26 U/L (ref 0–35)
AST: 19 U/L (ref 0–37)
Albumin: 4.3 g/dL (ref 3.5–5.2)
Alkaline Phosphatase: 61 U/L (ref 39–117)
BUN: 17 mg/dL (ref 6–23)
CO2: 30 mEq/L (ref 19–32)
Calcium: 9.1 mg/dL (ref 8.4–10.5)
Chloride: 102 mEq/L (ref 96–112)
Creatinine, Ser: 0.74 mg/dL (ref 0.40–1.20)
GFR: 83.11 mL/min (ref >60.00)
Glucose, Bld: 91 mg/dL (ref 70–99)
Potassium: 4.1 mEq/L (ref 3.5–5.1)
Sodium: 139 mEq/L (ref 135–145)
Total Bilirubin: 0.6 mg/dL (ref 0.2–1.2)
Total Protein: 6.9 g/dL (ref 6.0–8.3)

## 2022-09-21 LAB — LIPID PANEL
Cholesterol: 183 mg/dL (ref 0–200)
HDL: 67 mg/dL (ref >39.00)
LDL Cholesterol: 98 mg/dL (ref 0–99)
NonHDL: 116.48
Total CHOL/HDL Ratio: 3
Triglycerides: 90 mg/dL (ref 0.0–149.0)
VLDL: 18 mg/dL (ref 0.0–40.0)

## 2022-09-21 NOTE — Patient Instructions (Addendum)
Let us know when you get  vaccines below  [facility] contact Please call to schedule visit and/or procedure Address: [address], [place], [place] [address] Phone: [phone]   Sign release of information at the check out desk for last colonoscopy  Please stop by lab before you go If you have mychart- we will send your results within 3 business days of [place] receiving them.  If you do not have mychart- we will call you about results within 5 business days of [place] receiving them.  *please also note that you will see labs on mychart as soon as they post. I will later go in and write notes on them- will say "notes from Dr. Yong Channel"   Ms. Melaragno , Thank you for taking time to come for your Medicare Wellness Visit. I appreciate your ongoing commitment to your health goals. Please review the following plan we discussed and let me know if I can assist you in the future.   These are the goals we discussed: Great job with gradual weight loss- keep up the great work!   2. Required Immunizations needed:  Flu shot- plans to do october, Prevnar 20- wants to hold off as has mild head cold, COVID-19 updated vaccination recommended at pharmacy, Tdap recommended at pharmacy   This is a list of the screening recommended for you and due dates:  Health Maintenance  Topic Date Due   COVID-19 Vaccine (5 - Moderna risk series) 10/07/2022*   Flu Shot  03/28/2023*   Pneumonia Vaccine (2 - PCV) 09/22/2023*   Tetanus Vaccine  09/22/2023*   Mammogram  10/22/2023   Colon Cancer Screening  02/15/2028   DEXA scan (bone density measurement)  Completed   Hepatitis C Screening: USPSTF Recommendation to screen - Ages 81-79 yo.  Completed   Zoster (Shingles) Vaccine  Completed   HPV Vaccine  Aged Out  *Topic was postponed. The date shown is not the original due date.

## 2022-09-21 NOTE — Progress Notes (Signed)
Phone [phone]    Subjective:  Patient presents today for their annual wellness visit (initial)  Preventive Screening-Counseling & Management  Modifiable Risk Factors/behavioral risk assessment/psychosocial risk assessment Regular exercise: working daily and active. Walking about 2 days a week Diet: Great job with weight loss down 7 pounds from last year and 11 pounds from the year prior!  BMI remains in overweight category. Reasonably healthy diet Wt Readings from Last 3 Encounters:  09/21/22 177 lb 3.2 oz (80.4 kg)  09/19/21 184 lb 3.2 oz (83.6 kg)  09/17/20 188 lb 3.2 oz (85.4 kg)  Smoking Status: Never Smoker Second Hand Smoking status: No smokers in home Alcohol intake: 3 per week Other substance abuse/illicit drugs: none  Cardiac risk factors:  advanced age (older than 49 for men, 39 for women)  Mild untreated hyperlipidemia  The 10-year ASCVD risk score (Arnett DK, et al., 2019) is: 7.4%  No hypertension  No diabetes.  Family History: Heart disease in father at 34- was not smoker Family History  Problem Relation Age of Onset   Leukemia Mother        died age 74- not sure if that was cause.    Heart disease Father        age 40.    Hypertension Sister    Hypertension Brother    Depression Screen/risk evaluation Risk factors: None. PHQ9 0     09/21/2022    8:15 AM 09/19/2021    8:17 AM 09/17/2020    8:51 AM 11/20/2019    9:33 AM  Depression screen PHQ 2/9  Decreased Interest 0 0 0 0  Down, Depressed, Hopeless 0 0 0 0  PHQ - 2 Score 0 0 0 0  Altered sleeping 0   0  Tired, decreased energy 0   0  Change in appetite 0   0  Feeling bad or failure about yourself  0   0  Trouble concentrating 0   0  Moving slowly or fidgety/restless 0   0  Suicidal thoughts 0   0  PHQ-9 Score 0   0  Difficult doing work/chores Not difficult at all   Not difficult at all    Functional ability and level of safety Mobility assessment:  timed get up and go <12  seconds Activities of Daily Living- Independent in ADLs (toileting, bathing, dressing, transferring, eating) and in IADLs (shopping, housekeeping, managing own medications, and handling finances)- still Home Safety: Loose rugs (none), smoke detectors (up to date), small pets (2 cats but good about not getting underneath them), grab bars (not yet- doesn't need), stairs (none), life-alert system (cell phone) Hearing Difficulties: patient declines. Sometimes husband mentions-could refer- she wants to hold off for now Fall Risk: None     09/21/2022    8:15 AM 09/19/2021    8:17 AM 09/17/2020    8:54 AM 11/20/2019    9:32 AM  Fall Risk   Falls in the past year? 0 0 0 0  Number falls in past yr: 0 0 0 0  Injury with Fall? 0 0 0 0  Risk for fall due to : No Fall Risks No Fall Risks    Follow up Falls evaluation completed Falls evaluation completed    Opioid use history:  no current or long term opioids use Self assessment of health status: "good"  Cognitive Testing             No reported trouble.   Mini cog: normal clock draw. 3/3 delayed recall. Normal  test result   List the Names of Other Physician/Practitioners you currently use: Patient Care Team: Shelva Majestic, MD as PCP - General (Family Medicine)  Required Immunizations needed today:  Flu shot- plans to do october, Prevnar 20- wants to hold off as has mild head cold, COVID-19 updated vaccination recommended at pharmacy, Tdap recommended at pharmacy  Immunization History  Administered Date(s) Administered   Fluad Quad(high Dose 65+) 09/17/2020, 09/19/2021   Influenza, High Dose Seasonal PF 09/09/2019   Moderna Sars-Covid-2 Vaccination 01/24/2020, 02/21/2020, 10/29/2020, 04/01/2021   Pneumococcal Polysaccharide-23 09/17/2020   Zoster Recombinat (Shingrix) 04/15/2019, 11/20/2019   Health Maintenance  Topic Date Due   COVID-19 Vaccine (5 - Moderna risk series) 10/07/2022*   Flu Shot  03/28/2023*   Pneumonia Vaccine (2 - PCV)  09/22/2023*   Tetanus Vaccine  09/22/2023*   Mammogram  10/22/2023   Colon Cancer Screening  02/15/2028   DEXA scan (bone density measurement)  Completed   Hepatitis C Screening: USPSTF Recommendation to screen - Ages 81-79 yo.  Completed   Zoster (Shingles) Vaccine  Completed   HPV Vaccine  Aged Out  *Topic was postponed. The date shown is not the original due date.    Screening tests-  Colon cancer screening- 02/14/2018 with 3-year repeat planned we have been unable to obtain records despite multiple attempts- will refer again and also see if we can get records again or if GI can - [place], [place] Lung Cancer screening- never smoker, not a candidate Skin cancer screening- saw derm 2 years ago- no new spots since that time. Not seeing anyone regularly. Advised sunscreen use 4. Cervical cancer screening- past age based screening recommendations- does not recall abnormal 5. Breast cancer screening- prefers self exams, last mammogram 3D on 10/21/2021  The following were reviewed and entered/updated in epic if appropriate: Past Medical History:  Diagnosis Date   Mild hyperlipidemia 09/17/2020   LDL >70    Overweight    Patient Active Problem List   Diagnosis Date Noted   Mild hyperlipidemia 09/17/2020    Priority: Medium    Overweight     Priority: Medium    Past Surgical History:  Procedure Laterality Date   APPENDECTOMY     CESAREAN SECTION     Family History  Problem Relation Age of Onset   Leukemia Mother        died age 55- not sure if that was cause.    Heart disease Father        age 67.    Hypertension Sister    Hypertension Brother    Medications- reviewed and updated    Allergies-reviewed and updated No Known Allergies  Social History   Socioeconomic History   Marital status: Married    Spouse name: John   Number of children: 2   Years of education: Not on file   Highest education level: Not on file  Occupational History   Not on file  Tobacco  Use   Smoking status: Never   Smokeless tobacco: Never  Vaping Use   Vaping Use: Never used  Substance and Sexual Activity   Alcohol use: Yes    Comment: 2-3 per week   Drug use: Never   Sexual activity: Not Currently  Other Topics Concern   Not on file  Social History Narrative   Married 1992. No children with current husband. 2 step children- Francis Gaines his kids. 2 children-Dulcie and Morrie Sheldon are his wife's kids. 7 grandkids total between kids and grandkids.  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      Hobbies: enjoys going to parks, time with dog, shopping, golfing   Social Determinants of Health   Financial Resource Strain: Low Risk  (09/17/2020)no reported change today   Overall Physicist, medical Strain (CARDIA)    Difficulty of Paying Living Expenses: Not hard at all  Food Insecurity: No Food Insecurity (09/17/2020)- no reported change today   Hunger Vital Sign    Worried About Running Out of Food in the Last Year: Never true    Ran Out of Food in the Last Year: Never true  Transportation Needs: No Transportation Needs (09/17/2020)-no reported changes today   PRAPARE - Administrator, Civil Service (Medical): No    Lack of Transportation (Non-Medical): No  Physical Activity: Insufficiently Active (09/17/2020)- walking 2 days a week still    Exercise Vital Sign    Days of Exercise per Week: 2 days    Minutes of Exercise per Session: 30 min  Stress: No Stress Concern Present (09/17/2020)-- none    [employer] of Occupational Health - Occupational Stress Questionnaire    Feeling of Stress : Not at all  Social Connections: Moderately Integrated (11/20/2019)- stable    Social Connection and Isolation Panel [NHANES]    Frequency of Communication with  Friends and Family: Twice a week    Frequency of Social Gatherings with Friends and Family: Once a week    Attends Religious Services: More than 4 times per year    Active Member of [employer] or Organizations: No    Attends Engineer, structural: Never    Marital Status: Married     Objective:  BP 132/80   Pulse 71   Temp 97.7 F (36.5 C)   Ht 5\' 7"  (1.702 m)   Wt 177 lb 3.2 oz (80.4 kg)   SpO2 97%   BMI 27.75 kg/m  Gen: NAD, resting comfortably HEENT: Mucous membranes are moist. Oropharynx normal Neck: no thyromegaly CV: RRR no murmurs rubs or gallops Lungs: CTAB no crackles, wheeze, rhonchi Abdomen: soft/nontender/nondistended/normal bowel sounds. No rebound or guarding.  Ext: no edema Skin: warm, dry Neuro: grossly normal, moves all extremities, PERRLA   Assessment/Plan:  AWV completed Educated, counseled and referred based on above elements Educated, counseled and referred as appropriate for preventative needs Discussed and documented a written plan for preventiative services and screenings with personalized health advice- After Visit Summary was given to patient which included this plan  (patient opted for digital delivery only)  Status of chronic or acute concerns   #hyperlipidemia S: Medication: None The 10-year ASCVD risk score (Arnett DK, et al., 2019) is: 7.4%  Lab Results  Component Value Date   CHOL 187 09/19/2021   HDL 70.20 09/19/2021   LDLCALC 106 (H) 09/19/2021   TRIG 53.0 09/19/2021   CHOLHDL 3 09/19/2021   A/P: Discussed possible CT cardiac scoring if risks elevate further above 7.5%- but has had some weight loss so hopeful stable or improved    Recommended follow up: Return in about 1 year (around 09/22/2023) for annual wellness visit- as long as you remain so healthy! sooner if you need 09/24/2023.   Lab/Order associations: FASTING   ICD-10-CM   1. Preventative health care  Z00.00  2. Mild hyperlipidemia  E78.5     3. Screen for colon cancer   Z12.11       No orders of the defined types were placed in this encounter.   Return precautions advised.  Tana Conch, MD

## 2022-11-03 ENCOUNTER — Telehealth: Payer: Self-pay | Admitting: Family Medicine

## 2022-11-03 NOTE — Telephone Encounter (Signed)
Caller States: -pt received her COVID-19 booster and 2023 Flu Shot at [facility] on 10/01/22

## 2022-11-03 NOTE — Telephone Encounter (Signed)
LVM FOR PATIENT TO CALL [phone] TO SCHEDULE AWV WITH HEALTH COACH 

## 2022-11-03 NOTE — Telephone Encounter (Signed)
Noted  

## 2022-11-05 ENCOUNTER — Ambulatory Visit: Payer: Medicare Other

## 2022-11-05 VITALS — Wt 172.0 lb

## 2022-11-05 DIAGNOSIS — Z0000 Encounter for general adult medical examination without abnormal findings: Secondary | ICD-10-CM | POA: Diagnosis not present

## 2022-11-05 NOTE — Progress Notes (Signed)
I connected with  Estell Harpin on 11/05/22 by a audio enabled telemedicine application and verified that I am speaking with the correct person using two identifiers.  Patient Location: Home  Provider Location: Office/Clinic  I discussed the limitations of evaluation and management by telemedicine. The patient expressed understanding and agreed to proceed.   Subjective:   Mazi Bitting is a 66 y.o. female who presents for Medicare Annual (Subsequent) preventive examination.  Review of Systems     Cardiac Risk Factors include: advanced age (>62men, >39 women);dyslipidemia     Objective:    Today's Vitals   11/05/22 0747  Weight: 172 lb (78 kg)   Body mass index is 26.94 kg/m.     11/05/2022    7:51 AM  Advanced Directives  Does Patient Have a Medical Advance Directive? No  Would patient like information on creating a medical advance directive? No - Patient declined    Current Medications (verified) No outpatient encounter medications on file as of 11/05/2022.   No facility-administered encounter medications on file as of 11/05/2022.    Allergies (verified) Patient has no known allergies.   History: Past Medical History:  Diagnosis Date   Mild hyperlipidemia 09/17/2020   LDL >70    Overweight    Past Surgical History:  Procedure Laterality Date   APPENDECTOMY     CESAREAN SECTION     Family History  Problem Relation Age of Onset   Leukemia Mother        died age 68- not sure if that was cause.    Heart disease Father        age 72.    Hypertension Sister    Hypertension Brother    Social History   Socioeconomic History   Marital status: Married    Spouse name: John   Number of children: 2   Years of education: Not on file   Highest education level: Not on file  Occupational History   Not on file  Tobacco Use   Smoking status: Never   Smokeless tobacco: Never  Vaping Use   Vaping Use: Never used  Substance and Sexual Activity   Alcohol use: Yes     Comment: 2-3 per week   Drug use: Never   Sexual activity: Not Currently  Other Topics Concern   Not on file  Social History Narrative   Married 1992. No children with current husband. 2 step children- Winfield Rast his kids. 2 children-Anntoinette and Caryl Pina are his wife's kids. 7 grandkids total between kids and grandkids.    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, Consulting civil engineer, biggest customer was [place] [employer]- buys equipment for them      Hobbies: enjoys going to parks, time with dog, shopping, golfing   Social Determinants of Health   Financial Resource Strain: Low Risk  (11/05/2022)   Overall Financial Resource Strain (CARDIA)    Difficulty of Paying Living Expenses: Not hard at all  Food Insecurity: No Food Insecurity (11/05/2022)   Hunger Vital Sign    Worried About Running Out of Food in the Last Year: Never true    [place] in the Last Year: Never true  Transportation Needs: No Transportation Needs (11/05/2022)   PRAPARE - Hydrologist (Medical): No    Lack of Transportation (  Non-Medical): No  Physical Activity: Sufficiently Active (11/05/2022)   Exercise Vital Sign    Days of Exercise per Week: 5 days    Minutes of Exercise per Session: 30 min  Stress: No Stress Concern Present (11/05/2022)   [place]    Feeling of Stress : Not at all  Social Connections: Moderately Isolated (11/05/2022)   Social Connection and Isolation Panel [NHANES]    Frequency of Communication with Friends and Family: Three times a week    Frequency of Social Gatherings with Friends and Family: More than three times a week    Attends Religious Services: Never    Marine scientist or Organizations: No    Attends Music therapist: Never    Marital  Status: Married    Tobacco Counseling Counseling given: Not Answered   Clinical Intake:  Pre-visit preparation completed: Yes  Pain : No/denies pain     BMI - recorded: 26.94 Nutritional Status: BMI 25 -29 Overweight Nutritional Risks: None Diabetes: No  How often do you need to have someone help you when you read instructions, pamphlets, or other written materials from your doctor or pharmacy?: 1 - Never  Diabetic?no  Interpreter Needed?: No  Information entered by :: Charlott Rakes, LPN   Activities of Daily Living    11/05/2022    7:52 AM 11/04/2022    5:35 PM  In your present state of health, do you have any difficulty performing the following activities:  Hearing? 0 0  Vision? 0 0  Difficulty concentrating or making decisions? 0 0  Walking or climbing stairs? 0 0  Dressing or bathing? 0 0  Doing errands, shopping? 0 0  Preparing Food and eating ? N N  Using the Toilet? N N  In the past six months, have you accidently leaked urine? N N  Do you have problems with loss of bowel control? N N  Managing your Medications? N N  Managing your Finances? N N  Housekeeping or managing your Housekeeping? N N    Patient Care Team: Marin Olp, MD as PCP - General (Family Medicine)  Indicate any recent Medical Services you may have received from other than [facility] providers in the past year (date may be approximate).     Assessment:   This is a routine wellness examination for Khalil.  Hearing/Vision screen Hearing Screening - Comments:: Pt denies any hearing issues  Vision Screening - Comments:: Pt follows up with [facility] for annual eye exams   Dietary issues and exercise activities discussed: Current Exercise Habits: Home exercise routine, Type of exercise: walking;Other - see comments, Time (Minutes): 30, Frequency (Times/Week): 5, Weekly Exercise (Minutes/Week): 150   Goals Addressed             This Visit's Progress    Patient Stated        Stay active and lose a few more lbs        Depression Screen    11/05/2022    7:49 AM 09/21/2022    8:15 AM 09/19/2021    8:17 AM 09/17/2020    8:51 AM 11/20/2019    9:33 AM  PHQ 2/9 Scores  PHQ - 2 Score 0 0 0 0 0  PHQ- 9 Score 0 0   0    Fall Risk    11/05/2022    7:52 AM 11/04/2022    5:35 PM 09/21/2022    8:15 AM 09/19/2021  8:17 AM 09/17/2020    8:54 AM  Fall Risk   Falls in the past year? 0 0 0 0 0  Number falls in past yr: 0 0 0 0 0  Injury with Fall? 0 0 0 0 0  Risk for fall due to : Impaired vision  No Fall Risks No Fall Risks   Follow up Falls prevention discussed  Falls evaluation completed Falls evaluation completed     [place]:  Any stairs in or around the home? Yes  If so, are there any without handrails? No  Home free of loose throw rugs in walkways, pet beds, electrical cords, etc? Yes  Adequate lighting in your home to reduce risk of falls? Yes   ASSISTIVE DEVICES UTILIZED TO PREVENT FALLS:  Life alert? No  Use of a cane, walker or w/c? No  Grab bars in the bathroom? Yes  Shower chair or bench in shower? Yes  Elevated toilet seat or a handicapped toilet? No   TIMED UP AND GO:  Was the test performed? No .   Cognitive Function:        11/05/2022    7:52 AM  6CIT Screen  What Year? 0 points  What month? 0 points  What time? 0 points  Count back from 20 0 points  Months in reverse 0 points  Repeat phrase 0 points  Total Score 0 points    Immunizations Immunization History  Administered Date(s) Administered   Fluad Quad(high Dose 65+) 09/17/2020, 09/19/2021   Influenza, High Dose Seasonal PF 09/09/2019, 10/01/2022   Moderna SARS-COV2 Booster Vaccination 10/01/2022   Moderna Sars-Covid-2 Vaccination 01/24/2020, 02/21/2020, 10/29/2020, 04/01/2021   Pneumococcal Polysaccharide-23 09/17/2020   Zoster Recombinat (Shingrix) 04/15/2019, 11/20/2019    TDAP status: Due, Education has been provided regarding  the importance of this vaccine. Advised may receive this vaccine at local pharmacy or Health Dept. Aware to provide a copy of the vaccination record if obtained from local pharmacy or Health Dept. Verbalized acceptance and understanding.  Flu Vaccine status: Up to date  Pneumococcal vaccine status: Up to date  Covid-19 vaccine status: Completed vaccines  Qualifies for Shingles Vaccine? Yes   Zostavax completed Yes   Shingrix Completed?: Yes  Screening Tests Health Maintenance  Topic Date Due   Pneumonia Vaccine 22+ Years old (2 - PCV) 09/22/2023 (Originally 09/17/2021)   TETANUS/TDAP  09/22/2023 (Originally 04/05/1975)   COVID-19 Vaccine (5 - Moderna risk series) 11/26/2022   MAMMOGRAM  10/22/2023   Medicare Annual Wellness (AWV)  11/06/2023   COLONOSCOPY (Pts 45-40yrs Insurance coverage will need to be confirmed)  02/15/2028   INFLUENZA VACCINE  Completed   DEXA SCAN  Completed   Hepatitis C Screening  Completed   Zoster Vaccines- Shingrix  Completed   HPV VACCINES  Aged Out    Health Maintenance  There are no preventive care reminders to display for this patient.  Colorectal cancer screening: Type of screening: Colonoscopy. Completed 02/14/18. Repeat every 10 years  Mammogram status: Completed 10/21/21. Repeat every year  Bone Density status: Completed 09/24/20. Results reflect: Bone density results: NORMAL. Repeat every 2 years.  Additional Screening:  Hepatitis C Screening: Completed 09/17/20  Vision Screening: Recommended annual ophthalmology exams for early detection of glaucoma and other disorders of the eye. Is the patient up to date with their annual eye exam?  Yes  Who is the provider or what is the name of the office in which the patient attends annual eye exams?  [facility]  If pt is not established with a provider, would they like to be referred to a provider to establish care? No .   Dental Screening: Recommended annual dental exams for proper oral  hygiene  Community Resource Referral / Chronic Care Management: CRR required this visit?  No   CCM required this visit?  No      Plan:     I have personally reviewed and noted the following in the patient's chart:   Medical and social history Use of alcohol, tobacco or illicit drugs  Current medications and supplements including opioid prescriptions. Patient is not currently taking opioid prescriptions. Functional ability and status Nutritional status Physical activity Advanced directives List of other physicians Hospitalizations, surgeries, and ER visits in previous 12 months Vitals Screenings to include cognitive, depression, and falls Referrals and appointments  In addition, I have reviewed and discussed with patient certain preventive protocols, quality metrics, and best practice recommendations. A written personalized care plan for preventive services as well as general preventive health recommendations were provided to patient.     Marzella Schlein, LPN   00/02/7047   Nurse Notes: none

## 2022-11-05 NOTE — Patient Instructions (Signed)
[name] , Thank you for taking time to come for your Medicare Wellness Visit. I appreciate your ongoing commitment to your health goals. Please review the following plan we discussed and let me know if I can assist you in the future.   These are the goals we discussed:  Goals      Patient Stated     Stay active and lose a few more lbs         This is a list of the screening recommended for you and due dates:  Health Maintenance  Topic Date Due   Pneumonia Vaccine (2 - PCV) 09/22/2023*   Tetanus Vaccine  09/22/2023*   COVID-19 Vaccine (5 - Moderna risk series) 11/26/2022   Mammogram  10/22/2023   Medicare Annual Wellness Visit  11/06/2023   Colon Cancer Screening  02/15/2028   Flu Shot  Completed   DEXA scan (bone density measurement)  Completed   Hepatitis C Screening: USPSTF Recommendation to screen - Ages 18-79 yo.  Completed   Zoster (Shingles) Vaccine  Completed   HPV Vaccine  Aged Out  *Topic was postponed. The date shown is not the original due date.    Advanced directives: Advance directive discussed with you today. Even though you declined this today please call our office should you change your mind and we can give you the proper paperwork for you to fill out.  Conditions/risks identified: stay active and lose a few more lbs   Next appointment: Follow up in one year for your annual wellness visit    Preventive Care 68 Years and Older, Female Preventive care refers to lifestyle choices and visits with your health care provider that can promote health and wellness. What does preventive care include? A yearly physical exam. This is also called an annual well check. Dental exams once or twice a year. Routine eye exams. Ask your health care provider how often you should have your eyes checked. Personal lifestyle choices, including: Daily care of your teeth and gums. Regular physical activity. Eating a healthy diet. Avoiding tobacco and drug use. Limiting alcohol  use. Practicing safe sex. Taking low-dose aspirin every day. Taking vitamin and mineral supplements as recommended by your health care provider. What happens during an annual well check? The services and screenings done by your health care provider during your annual well check will depend on your age, overall health, lifestyle risk factors, and family history of disease. Counseling  Your health care provider may ask you questions about your: Alcohol use. Tobacco use. Drug use. Emotional well-being. Home and relationship well-being. Sexual activity. Eating habits. History of falls. Memory and ability to understand (cognition). Work and work Astronomer. Reproductive health. Screening  You may have the following tests or measurements: Height, weight, and BMI. Blood pressure. Lipid and cholesterol levels. These may be checked every 5 years, or more frequently if you are over 68 years old. Skin check. Lung cancer screening. You may have this screening every year starting at age 68 if you have a 30-pack-year history of smoking and currently smoke or have quit within the past 15 years. Fecal occult blood test (FOBT) of the stool. You may have this test every year starting at age 68. Flexible sigmoidoscopy or colonoscopy. You may have a sigmoidoscopy every 5 years or a colonoscopy every 10 years starting at age 68. Hepatitis C blood test. Hepatitis B blood test. Sexually transmitted disease (STD) testing. Diabetes screening. This is done by checking your blood sugar (glucose) after you  have not eaten for a while (fasting). You may have this done every 1-3 years. Bone density scan. This is done to screen for osteoporosis. You may have this done starting at age 68. Mammogram. This may be done every 1-2 years. Talk to your health care provider about how often you should have regular mammograms. Talk with your health care provider about your test results, treatment options, and if necessary,  the need for more tests. Vaccines  Your health care provider may recommend certain vaccines, such as: Influenza vaccine. This is recommended every year. Tetanus, diphtheria, and acellular pertussis (Tdap, Td) vaccine. You may need a Td booster every 10 years. Zoster vaccine. You may need this after age 68. Pneumococcal 13-valent conjugate (PCV13) vaccine. One dose is recommended after age 68. Pneumococcal polysaccharide (PPSV23) vaccine. One dose is recommended after age 68. Talk to your health care provider about which screenings and vaccines you need and how often you need them. This information is not intended to replace advice given to you by your health care provider. Make sure you discuss any questions you have with your health care provider. Document Released: 01/10/2016 Document Revised: 09/02/2016 Document Reviewed: 10/15/2015 Elsevier Interactive Patient Education  2017 [place] Prevention in the Home Falls can cause injuries. They can happen to people of all ages. There are many things you can do to make your home safe and to help prevent falls. What can I do on the outside of my home? Regularly fix the edges of walkways and driveways and fix any cracks. Remove anything that might make you trip as you walk through a door, such as a raised step or threshold. Trim any bushes or trees on the path to your home. Use bright outdoor lighting. Clear any walking paths of anything that might make someone trip, such as rocks or tools. Regularly check to see if handrails are loose or broken. Make sure that both sides of any steps have handrails. Any raised decks and porches should have guardrails on the edges. Have any leaves, snow, or ice cleared regularly. Use sand or salt on walking paths during winter. Clean up any spills in your garage right away. This includes oil or grease spills. What can I do in the bathroom? Use night lights. Install grab bars by the toilet and in the  tub and shower. Do not use towel bars as grab bars. Use non-skid mats or decals in the tub or shower. If you need to sit down in the shower, use a plastic, non-slip stool. Keep the floor dry. Clean up any water that spills on the floor as soon as it happens. Remove soap buildup in the tub or shower regularly. Attach bath mats securely with double-sided non-slip rug tape. Do not have throw rugs and other things on the floor that can make you trip. What can I do in the bedroom? Use night lights. Make sure that you have a light by your bed that is easy to reach. Do not use any sheets or blankets that are too big for your bed. They should not hang down onto the floor. Have a firm chair that has side arms. You can use this for support while you get dressed. Do not have throw rugs and other things on the floor that can make you trip. What can I do in the kitchen? Clean up any spills right away. Avoid walking on wet floors. Keep items that you use a lot in easy-to-reach places. If you need  to reach something above you, use a strong step stool that has a grab bar. Keep electrical cords out of the way. Do not use floor polish or wax that makes floors slippery. If you must use wax, use non-skid floor wax. Do not have throw rugs and other things on the floor that can make you trip. What can I do with my stairs? Do not leave any items on the stairs. Make sure that there are handrails on both sides of the stairs and use them. Fix handrails that are broken or loose. Make sure that handrails are as long as the stairways. Check any carpeting to make sure that it is firmly attached to the stairs. Fix any carpet that is loose or worn. Avoid having throw rugs at the top or bottom of the stairs. If you do have throw rugs, attach them to the floor with carpet tape. Make sure that you have a light switch at the top of the stairs and the bottom of the stairs. If you do not have them, ask someone to add them for  you. What else can I do to help prevent falls? Wear shoes that: Do not have high heels. Have rubber bottoms. Are comfortable and fit you well. Are closed at the toe. Do not wear sandals. If you use a stepladder: Make sure that it is fully opened. Do not climb a closed stepladder. Make sure that both sides of the stepladder are locked into place. Ask someone to hold it for you, if possible. Clearly mark and make sure that you can see: Any grab bars or handrails. First and last steps. Where the edge of each step is. Use tools that help you move around (mobility aids) if they are needed. These include: Canes. Walkers. Scooters. Crutches. Turn on the lights when you go into a dark area. Replace any light bulbs as soon as they burn out. Set up your furniture so you have a clear path. Avoid moving your furniture around. If any of your floors are uneven, fix them. If there are any pets around you, be aware of where they are. Review your medicines with your doctor. Some medicines can make you feel dizzy. This can increase your chance of falling. Ask your doctor what other things that you can do to help prevent falls. This information is not intended to replace advice given to you by your health care provider. Make sure you discuss any questions you have with your health care provider. Document Released: 10/10/2009 Document Revised: 05/21/2016 Document Reviewed: 01/18/2015 Elsevier Interactive Patient Education  2017 [employer].

## 2023-01-12 ENCOUNTER — Other Ambulatory Visit: Payer: Self-pay | Admitting: Family Medicine

## 2023-01-12 DIAGNOSIS — Z1231 Encounter for screening mammogram for malignant neoplasm of breast: Secondary | ICD-10-CM

## 2023-01-21 ENCOUNTER — Ambulatory Visit
Admission: RE | Admit: 2023-01-21 | Discharge: 2023-01-21 | Disposition: A | Discharge status: Home / Self Care | Payer: Medicare Other | Source: Ambulatory Visit

## 2023-01-21 DIAGNOSIS — Z1231 Encounter for screening mammogram for malignant neoplasm of breast: Secondary | ICD-10-CM

## 2023-02-01 ENCOUNTER — Encounter: Payer: Self-pay | Admitting: Family Medicine

## 2023-08-12 ENCOUNTER — Encounter (INDEPENDENT_AMBULATORY_CARE_PROVIDER_SITE_OTHER): Payer: Self-pay

## 2023-09-23 ENCOUNTER — Ambulatory Visit: Payer: Medicare Other | Admitting: Family Medicine

## 2023-10-14 ENCOUNTER — Telehealth: Payer: Self-pay | Admitting: Family Medicine

## 2023-10-14 NOTE — Telephone Encounter (Signed)
FYI Received Covid and Flu shot at [facility] on 10/12/23

## 2023-10-15 NOTE — Telephone Encounter (Signed)
Vaccines documented.  

## 2023-12-30 ENCOUNTER — Encounter: Payer: Self-pay | Admitting: Family Medicine

## 2023-12-30 ENCOUNTER — Ambulatory Visit: Payer: Medicare Other | Admitting: Family Medicine

## 2023-12-30 VITALS — BP 124/74 | HR 77 | Temp 97.3°F | Ht 67.0 in | Wt 175.0 lb

## 2023-12-30 DIAGNOSIS — Z0000 Encounter for general adult medical examination without abnormal findings: Secondary | ICD-10-CM | POA: Diagnosis not present

## 2023-12-30 DIAGNOSIS — Z23 Encounter for immunization: Secondary | ICD-10-CM | POA: Diagnosis not present

## 2023-12-30 DIAGNOSIS — E785 Hyperlipidemia, unspecified: Secondary | ICD-10-CM

## 2023-12-30 DIAGNOSIS — Z1211 Encounter for screening for malignant neoplasm of colon: Secondary | ICD-10-CM

## 2023-12-30 LAB — COMPREHENSIVE METABOLIC PANEL
ALT: 20 U/L (ref 0–35)
AST: 22 U/L (ref 0–37)
Albumin: 4.6 g/dL (ref 3.5–5.2)
Alkaline Phosphatase: 58 U/L (ref 39–117)
BUN: 15 mg/dL (ref 6–23)
CO2: 27 meq/L (ref 19–32)
Calcium: 9.1 mg/dL (ref 8.4–10.5)
Chloride: 102 meq/L (ref 96–112)
Creatinine, Ser: 0.74 mg/dL (ref 0.40–1.20)
GFR: 82.37 mL/min (ref >60.00)
Glucose, Bld: 94 mg/dL (ref 70–99)
Potassium: 4 meq/L (ref 3.5–5.1)
Sodium: 137 meq/L (ref 135–145)
Total Bilirubin: 0.5 mg/dL (ref 0.2–1.2)
Total Protein: 6.7 g/dL (ref 6.0–8.3)

## 2023-12-30 LAB — CBC WITH DIFFERENTIAL/PLATELET
Basophils Absolute: 0 10*3/uL (ref 0.0–0.1)
Basophils Relative: 0.5 % (ref 0.0–3.0)
Eosinophils Absolute: 0.1 10*3/uL (ref 0.0–0.7)
Eosinophils Relative: 1.8 % (ref 0.0–5.0)
HCT: 39.8 % (ref 36.0–46.0)
Hemoglobin: 13.3 g/dL (ref 12.0–15.0)
Lymphocytes Relative: 22.1 % (ref 12.0–46.0)
Lymphs Abs: 1.2 10*3/uL (ref 0.7–4.0)
MCHC: 33.5 g/dL (ref 30.0–36.0)
MCV: 93.8 fL (ref 78.0–100.0)
Monocytes Absolute: 0.3 10*3/uL (ref 0.1–1.0)
Monocytes Relative: 5.4 % (ref 3.0–12.0)
Neutro Abs: 3.9 10*3/uL (ref 1.4–7.7)
Neutrophils Relative %: 70.2 % (ref 43.0–77.0)
Platelets: 223 10*3/uL (ref 150.0–400.0)
RBC: 4.24 Mil/uL (ref 3.87–5.11)
RDW: 13.3 % (ref 11.5–15.5)
WBC: 5.6 10*3/uL (ref 4.0–10.5)

## 2023-12-30 LAB — LIPID PANEL
Cholesterol: 208 mg/dL — ABNORMAL HIGH (ref 0–200)
HDL: 84.4 mg/dL (ref >39.00)
LDL Cholesterol: 111 mg/dL — ABNORMAL HIGH (ref 0–99)
NonHDL: 123.82
Total CHOL/HDL Ratio: 2
Triglycerides: 64 mg/dL (ref 0.0–149.0)
VLDL: 12.8 mg/dL (ref 0.0–40.0)

## 2023-12-30 NOTE — Patient Instructions (Addendum)
 Let us  know when you get your TDAP at your pharmacy.  Prevnar 20 today  Please stop by lab before you go If you have mychart- we will send your results within 3 business days of us  receiving them.  If you do not have mychart- we will call you about results within 5 business days of us  receiving them.  *please also note that you will see labs on mychart as soon as they post. I will later go in and write notes on them- will say notes from Dr. Katrinka    Rebekah Bell , Thank you for taking time to come for your Medicare Wellness Visit. I appreciate your ongoing commitment to your health goals. Please review the following plan we discussed and let me know if I can assist you in the future.   These are the goals we discussed:  Goals      Patient Stated     Stay active and lose a few more lbs         This is a list of the screening recommended for you and due dates:  Health Maintenance  Topic Date Due   DTaP/Tdap/Td vaccine (1 - Tdap) Never done   COVID-19 Vaccine (6 - 2024-25 season) 12/07/2023   Pneumonia Vaccine (2 of 2 - PCV) 12/29/2024*   Medicare Annual Wellness Visit  12/29/2024   Mammogram  01/21/2025   Colon Cancer Screening  02/15/2028   Flu Shot  Completed   DEXA scan (bone density measurement)  Completed   Hepatitis C Screening  Completed   Zoster (Shingles) Vaccine  Completed   HPV Vaccine  Aged Out  *Topic was postponed. The date shown is not the original due date.

## 2023-12-30 NOTE — Addendum Note (Signed)
 Addended by: Gwenette Greet on: 12/30/2023 02:20 PM   Modules accepted: Orders

## 2023-12-30 NOTE — Progress Notes (Signed)
 Phone [phone]    Subjective:  Patient presents today for their annual wellness visit (subsequent)  Preventive Screening-Counseling & Management  Modifiable Risk Factors/behavioral risk assessment/psychosocial risk assessment Regular exercise: still 5 days a week Diet: down another 2 lbs this year- nice stable weight loss  Wt Readings from Last 3 Encounters:  12/30/23 175 lb (79.4 kg)  11/05/22 172 lb (78 kg)  09/21/22 177 lb 3.2 oz (80.4 kg)  Smoking Status: Never Smoker Second Hand Smoking status: No smokers in home Alcohol intake: 3 per week Other substance abuse/illicit drugs: none  Cardiac risk factors:   advanced age (older than 96 for men, 71 for women) - noted Mild untreated Hyperlipidemia  no Hypertension  No diabetes.  Family History: heart disease in father 79- was not smoker  Family History  Problem Relation Age of Onset   Leukemia Mother        died age 72- not sure if that was cause.    Heart disease Father        age 53.    Hypertension Sister    Hypertension Brother    Depression Screen/risk evaluation Risk factors: none. PHQ9 0     12/30/2023    1:59 PM 11/05/2022    7:49 AM 09/21/2022    8:15 AM 09/19/2021    8:17 AM 09/17/2020    8:51 AM  Depression screen PHQ 2/9  Decreased Interest 0 0 0 0 0  Down, Depressed, Hopeless 0 0 0 0 0  PHQ - 2 Score 0 0 0 0 0  Altered sleeping 0 0 0    Tired, decreased energy 0 0 0    Change in appetite 0 0 0    Feeling bad or failure about yourself  0 0 0    Trouble concentrating 0 0 0    Moving slowly or fidgety/restless 0 0 0    Suicidal thoughts 0 0 0    PHQ-9 Score 0 0 0    Difficult doing work/chores Not difficult at all Not difficult at all Not difficult at all      Functional ability and level of safety Mobility assessment:  timed get up and go <12 seconds Activities of Daily Living- Independent in ADLs (toileting, bathing, dressing, transferring, eating) and in IADLs (shopping, housekeeping,  managing own medications, and handling finances) Home Safety: Loose rugs (none), smoke detectors (up to date), small pets (has 2 cats but do not get under her feet), grab bars (doesn't need yet), stairs (none in current house other than front porch-8 there but usually doesn't use that), life-alert system ( would use cell phone) Hearing Difficulties: patient declines Fall Risk: None     12/30/2023    1:59 PM 11/05/2022    7:52 AM 11/04/2022    5:35 PM 09/21/2022    8:15 AM 09/19/2021    8:17 AM  Fall Risk   Falls in the past year? 0 0 0 0 0  Number falls in past yr: 0 0 0 0 0  Injury with Fall? 0 0 0 0 0  Risk for fall due to : No Fall Risks Impaired vision  No Fall Risks No Fall Risks  Follow up Falls evaluation completed Falls prevention discussed  Falls evaluation completed Falls evaluation completed  Opioid use history:  no current or long term opioids use Self assessment of health status: excellent  Cognitive Testing             No reported trouble.   6  CIT completed- score     12/30/2023    2:04 PM 11/05/2022    7:52 AM  6CIT Screen  What Year? 0 points 0 points  What month? 0 points 0 points  What time? 0 points 0 points  Count back from 20 0 points 0 points  Months in reverse 0 points 0 points  Repeat phrase 0 points 0 points  Total Score 0 points 0 points   List the Names of Other Physician/Practitioners you currently use: Patient Care Team: Katrinka Garnette KIDD, MD as PCP - General (Family Medicine) -[facility]  Required Immunizations needed today:  Prevnar 20, Tetanus, Diphtheria, and Pertussis (Tdap) recommended at pharmacy again this year , COVID is up to date  Immunization History  Administered Date(s) Administered   Fluad Quad(high Dose 65+) 09/17/2020, 09/19/2021   Influenza, High Dose Seasonal PF 09/09/2019, 10/01/2022, 10/12/2023   Moderna SARS-COV2 Booster Vaccination 10/01/2022   Moderna Sars-Covid-2 Vaccination 01/24/2020, 02/21/2020, 10/29/2020, 04/01/2021    Pfizer(Comirnaty)Fall Seasonal Vaccine 12 years and older 10/12/2023   Pneumococcal Polysaccharide-23 09/17/2020   Zoster Recombinant(Shingrix) 04/15/2019, 11/20/2019   Health Maintenance  Topic Date Due   DTaP/Tdap/Td vaccine (1 - Tdap) Never done   COVID-19 Vaccine (6 - 2024-25 season) 12/07/2023   Pneumonia Vaccine (2 of 2 - PCV) 12/29/2024*   Medicare Annual Wellness Visit  12/29/2024   Mammogram  01/21/2025   Colon Cancer Screening  02/15/2028   Flu Shot  Completed   DEXA scan (bone density measurement)  Completed   Hepatitis C Screening  Completed   Zoster (Shingles) Vaccine  Completed   HPV Vaccine  Aged Out  *Topic was postponed. The date shown is not the original due date.   Screening tests- updating AWV today Colon cancer screening-done in 2019- have been unable to get records- will try again to refer- we really don't have an option without getting records- hoping they can just move fowards  Lung Cancer screening- never smoker, not a candidate  Skin cancer screening- saw dermatology 3  years ago- no new spots since that time. Advised sunscreen use  Cervical cancer screening- past age based screening recommendations- does not recall abnormal  Breast cancer screening- prefers self exams, last mammogram 3D on 01/21/23   The following were reviewed and entered/updated in epic if appropriate: Past Medical History:  Diagnosis Date   Mild hyperlipidemia 09/17/2020   LDL >70    Overweight    Patient Active Problem List   Diagnosis Date Noted   Mild hyperlipidemia 09/17/2020    Priority: Medium    Overweight     Priority: Medium    Past Surgical History:  Procedure Laterality Date   APPENDECTOMY     CESAREAN SECTION      Family History  Problem Relation Age of Onset   Leukemia Mother        died age 34- not sure if that was cause.    Heart disease Father        age 20.    Hypertension Sister    Hypertension Brother     Medications- reviewed and updated No  current outpatient medications on file.   No current facility-administered medications for this visit.    Allergies-reviewed and updated No Known Allergies  Social History   Socioeconomic History   Marital status: Married    Spouse name: John   Number of children: 2   Years of education: Not on file   Highest education level: Not on file  Occupational History  Not on file  Tobacco Use   Smoking status: Never   Smokeless tobacco: Never  Vaping Use   Vaping status: Never Used  Substance and Sexual Activity   Alcohol use: Yes    Comment: 2-3 per week   Drug use: Never   Sexual activity: Not Currently  Other Topics Concern   Not on file  Social History Narrative   Married 1992. No children with current husband. 2 step children- Alan Clamp his kids. 2 children-Shamon and Rosina are his wife's kids. 7 grandkids total between kids and grandkids.    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      Hobbies: enjoys going to parks, time with dog, shopping, golfing   Social Drivers of Corporate Investment Banker Strain: Low Risk  (11/05/2022)-no reported change today   Overall Programmer, Applications (CARDIA)    Difficulty of Paying Living Expenses: Not hard at all  Food Insecurity: No Food Insecurity (11/05/2022)-no reported change today   Hunger Vital Sign    Worried About Running Out of Food in the Last Year: Never true    Ran Out of Food in the Last Year: Never true  Transportation Needs: No Transportation Needs (11/05/2022)-no reported change today   PRAPARE - Administrator, Civil Service (Medical): No    Lack of Transportation (Non-Medical): No  Physical Activity: Sufficiently Active (11/05/2022)-no reported change today   Exercise Vital Sign     Days of Exercise per Week: 5 days    Minutes of Exercise per Session: 30 min  Stress: No Stress Concern Present (11/05/2022)-no reported change today   [employer] of Occupational Health - Occupational Stress Questionnaire    Feeling of Stress : Not at all  Social Connections: Moderately Isolated (11/05/2022) -no reported change today   Social Connection and Isolation Panel [NHANES]    Frequency of Communication with Friends and Family: Three times a week    Frequency of Social Gatherings with Friends and Family: More than three times a week    Attends Religious Services: Never    Database Administrator or Organizations: No    Attends Engineer, Structural: Never    Marital Status: Married     Objective:  BP 124/74   Pulse 77   Temp (!) 97.3 F (36.3 C)   Ht 5' 7 (1.702 m)   Wt 175 lb (79.4 kg)   SpO2 98%   BMI 27.41 kg/m  Gen: NAD, resting comfortably HEENT: Mucous membranes are moist. Oropharynx normal Neck: no thyromegaly CV: RRR no murmurs rubs or gallops Lungs: CTAB no crackles, wheeze, rhonchi Abdomen: soft/nontender/nondistended/normal bowel sounds. No rebound or guarding.  Ext: no edema Skin: warm, dry Neuro: grossly normal, moves all extremities, PERRLA   Assessment/Plan:  AWV completed Educated, counseled and referred based on above elements Educated, counseled and referred as appropriate for preventative needs Discussed and documented a written plan for preventiative services and screenings with personalized health advice- After Visit Summary was given to patient which included this plan   Status of chronic or acute concerns   #hyperlipidemia S: Medication: None The 10-year ASCVD risk score (Arnett DK, et al., 2019) is: 7.4%  Lab Results  Component Value Date   CHOL  183 09/21/2022   HDL 67.00 09/21/2022   LDLCALC 98 09/21/2022   TRIG 90.0 09/21/2022   CHOLHDL 3 09/21/2022   A/P: 10-year ASCVD risk not substantially elevated-update with labs  today if above 7.5% consider coronary artery calcium scoring   Recommended follow up: Return in about 1 year (around 12/29/2024) for followup or sooner if needed.Schedule b4 you leave.    Lab/Order associations: FASTING   ICD-10-CM   1. Preventative health care  Z00.00     2. Mild hyperlipidemia  E78.5 Comprehensive metabolic panel    CBC with Differential/Platelet    Lipid panel    3. Screen for colon cancer  Z12.11 Ambulatory referral to Gastroenterology      No orders of the defined types were placed in this encounter.   Return precautions advised.  Garnette Lukes, MD

## 2024-01-25 ENCOUNTER — Other Ambulatory Visit: Payer: Self-pay | Admitting: Family Medicine

## 2024-01-25 DIAGNOSIS — Z1231 Encounter for screening mammogram for malignant neoplasm of breast: Secondary | ICD-10-CM

## 2024-02-10 ENCOUNTER — Ambulatory Visit
Admission: RE | Admit: 2024-02-10 | Discharge: 2024-02-10 | Disposition: A | Discharge status: Home / Self Care | Payer: Medicare Other | Source: Ambulatory Visit

## 2024-02-10 DIAGNOSIS — Z1231 Encounter for screening mammogram for malignant neoplasm of breast: Secondary | ICD-10-CM

## 2025-01-01 ENCOUNTER — Encounter: Payer: Self-pay | Admitting: Family Medicine

## 2025-01-01 ENCOUNTER — Encounter: Payer: Medicare Other | Admitting: Family Medicine

## 2025-01-01 ENCOUNTER — Ambulatory Visit: Payer: Self-pay | Admitting: Family Medicine

## 2025-01-01 VITALS — BP 112/78 | HR 74 | Temp 97.8°F | Ht 67.0 in | Wt 173.2 lb

## 2025-01-01 DIAGNOSIS — R067 Sneezing: Secondary | ICD-10-CM

## 2025-01-01 DIAGNOSIS — E663 Overweight: Secondary | ICD-10-CM | POA: Diagnosis not present

## 2025-01-01 DIAGNOSIS — J069 Acute upper respiratory infection, unspecified: Secondary | ICD-10-CM | POA: Diagnosis not present

## 2025-01-01 DIAGNOSIS — E785 Hyperlipidemia, unspecified: Secondary | ICD-10-CM

## 2025-01-01 DIAGNOSIS — Z1211 Encounter for screening for malignant neoplasm of colon: Secondary | ICD-10-CM

## 2025-01-01 DIAGNOSIS — Z131 Encounter for screening for diabetes mellitus: Secondary | ICD-10-CM

## 2025-01-01 DIAGNOSIS — R051 Acute cough: Secondary | ICD-10-CM | POA: Diagnosis not present

## 2025-01-01 LAB — CBC WITH DIFFERENTIAL/PLATELET
Basophils Absolute: 0 K/uL (ref 0.0–0.1)
Basophils Relative: 0.4 % (ref 0.0–3.0)
Eosinophils Absolute: 0.1 K/uL (ref 0.0–0.7)
Eosinophils Relative: 2.4 % (ref 0.0–5.0)
HCT: 37.9 % (ref 36.0–46.0)
Hemoglobin: 12.9 g/dL (ref 12.0–15.0)
Lymphocytes Relative: 31 % (ref 12.0–46.0)
Lymphs Abs: 0.9 K/uL (ref 0.7–4.0)
MCHC: 34 g/dL (ref 30.0–36.0)
MCV: 91.3 fl (ref 78.0–100.0)
Monocytes Absolute: 0.3 K/uL (ref 0.1–1.0)
Monocytes Relative: 9 % (ref 3.0–12.0)
Neutro Abs: 1.6 K/uL (ref 1.4–7.7)
Neutrophils Relative %: 57.2 % (ref 43.0–77.0)
Platelets: 176 K/uL (ref 150.0–400.0)
RBC: 4.15 Mil/uL (ref 3.87–5.11)
RDW: 12.5 % (ref 11.5–15.5)
WBC: 2.8 K/uL — ABNORMAL LOW (ref 4.0–10.5)

## 2025-01-01 LAB — LIPID PANEL
Cholesterol: 155 mg/dL (ref 28–200)
HDL: 60 mg/dL
LDL Cholesterol: 79 mg/dL (ref 10–99)
NonHDL: 94.56
Total CHOL/HDL Ratio: 3
Triglycerides: 76 mg/dL (ref 10.0–149.0)
VLDL: 15.2 mg/dL (ref 0.0–40.0)

## 2025-01-01 LAB — COMPREHENSIVE METABOLIC PANEL WITH GFR
ALT: 35 U/L (ref 3–35)
AST: 26 U/L (ref 5–37)
Albumin: 4.4 g/dL (ref 3.5–5.2)
Alkaline Phosphatase: 61 U/L (ref 39–117)
BUN: 11 mg/dL (ref 6–23)
CO2: 29 meq/L (ref 19–32)
Calcium: 8.6 mg/dL (ref 8.4–10.5)
Chloride: 100 meq/L (ref 96–112)
Creatinine, Ser: 0.66 mg/dL (ref 0.40–1.20)
GFR: 88.68 mL/min
Glucose, Bld: 86 mg/dL (ref 70–99)
Potassium: 3.6 meq/L (ref 3.5–5.1)
Sodium: 136 meq/L (ref 135–145)
Total Bilirubin: 0.4 mg/dL (ref 0.2–1.2)
Total Protein: 6.9 g/dL (ref 6.0–8.3)

## 2025-01-01 LAB — POCT INFLUENZA A/B
Influenza A, POC: NEGATIVE
Influenza B, POC: NEGATIVE

## 2025-01-01 LAB — HEMOGLOBIN A1C: Hgb A1c MFr Bld: 5.7 % (ref 4.6–6.5)

## 2025-01-01 LAB — POC COVID19 BINAXNOW: SARS Coronavirus 2 Ag: NEGATIVE

## 2025-01-01 MED ORDER — BENZONATATE 100 MG PO CAPS: 100.0000 mg | ORAL_CAPSULE | Freq: Three times a day (TID) | ORAL | 0 refills | Status: AC | PRN

## 2025-01-01 NOTE — Progress Notes (Signed)
 " Phone [phone]   Subjective:  Patient presents today for their annual follow up exam. Chief complaint-noted.   See problem oriented charting-  The following were reviewed and entered/updated in epic: Past Medical History:  Diagnosis Date   Mild hyperlipidemia 09/17/2020   LDL >70    Overweight    Patient Active Problem List   Diagnosis Date Noted   Mild hyperlipidemia 09/17/2020    Priority: Medium    Overweight     Priority: Medium    Past Surgical History:  Procedure Laterality Date   APPENDECTOMY     CESAREAN SECTION      Family History  Problem Relation Age of Onset   Leukemia Mother        died age 58- not sure if that was cause.    Heart disease Father        age 69.    Hypertension Sister    Hypertension Brother     Medications- reviewed and updated No current outpatient medications on file.   No current facility-administered medications for this visit.    Allergies-reviewed and updated Allergies[1]  Social History   Social History Narrative   Married 1992. No children with current husband. 2 step children- Alan Clamp his kids. She has 2 children-Nare and Rosina. 7 grandkids total between kids and grandkids. 3 great grandkids.    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 Oropharynx normal. Tympanic membrane normal bilaterally. Left nasal turbinate mildly erythematous and  swollen Neck: no thyromegaly CV: RRR no murmurs rubs or gallops Lungs: CTAB no crackles, wheeze, rhonchi Abdomen: soft/nontender/nondistended/normal bowel sounds. No rebound or guarding.  Ext: no edema Skin: warm, dry Neuro: grossly normal, moves all extremities, PERRLA  Results for orders placed or performed in visit on 01/01/25 (from the past 24 hours)  POCT Influenza A/B     Status: None   Collection Time: 01/01/25 11:14 AM  Result Value Ref Range   Influenza A, POC Negative Negative   Influenza B, POC Negative Negative  POC COVID-19     Status: None   Collection Time: 01/01/25 11:27 AM  Result Value Ref Range   SARS Coronavirus 2 Ag Negative Negative      Assessment and Plan   69 y.o. female presenting for annual exam health maintenance counseling Health Maintenance counseling: 1. Anticipatory guidance: Patient counseled regarding regular dental exams -q6 months, eye exams -,  avoiding smoking and second hand smoke , limiting alcohol to 1 beverage per day-3/week , no illicit drugs .   2. Risk factor reduction:  Advised patient of need for regular exercise and diet rich and fruits and vegetables to reduce risk of heart attack and stroke.  Exercise-active at work at [employer]- still encouraged exercise outside of work 150 minutes per week Diet/weight management-weight down 2  pounds in the last year-encouraged continuing efforts for mild weight loss. Ongoing nice steady weight loss  Wt Readings from Last 3 Encounters:  01/01/25 173 lb 3.2 oz (78.6 kg)  12/30/23 175 lb (79.4 kg)  11/05/22 172 lb (78 kg)  3. Immunizations/screenings/ancillary studies-declines COVID, flu as ill today.  Recommended Tdap at pharmacy  Immunization History  Administered Date(s) Administered   Fluad Quad(high Dose 65+) 09/17/2020, 09/19/2021   INFLUENZA, HIGH DOSE SEASONAL PF 09/09/2019, 10/01/2022, 10/12/2023   Moderna SARS-COV2 Booster Vaccination 10/01/2022   Moderna Sars-Covid-2 Vaccination  01/24/2020, 02/21/2020, 10/29/2020, 04/01/2021   PNEUMOCOCCAL CONJUGATE-20 12/30/2023   Pfizer(Comirnaty)Fall Seasonal Vaccine 12 years and older 10/12/2023   Pneumococcal Polysaccharide-23 09/17/2020   Zoster Recombinant(Shingrix) 04/15/2019, 11/20/2019   4. Cervical cancer screening- no history of abnormal Pap smear-past age-based screening recommendations 5. Breast cancer screening-  breast exam prefers self exams  and mammogram 02/10/2024 and encouraged yearly 6. Colon cancer screening - last exam 2019-we have been unable to get records on multiple attempts so will refer as we cannot validate the date of colonoscopy and could be beyond 10 years 7. Skin cancer screening-last saw dermatology 4 years ago. advised regular sunscreen use. Denies worrisome, changing, or new skin lesions.  8. Birth control/STD check- only active with husband and postmenopausal 9. Osteoporosis screening at 65- in 2021 was normal 10. Smoking associated screening -never smoker  Status of chronic or acute concerns   # cough/congestion S:symptom(s)  started 6 days ago on NYE . Was in bed all day for 2 days. Was able to go back to work. Did have fever up front but that has improved. Also with fatigue. Feels like a deeper cough. Improving some . Dayquil and Nyquil helped at first then felt more fatigued A/P: Flu test negative. Will do COVID at end of visit - so far negative mainly in case husband ill with same but shed be interested in Paxlovid as well. . Discussed natural course of upper respiratory infection (URI) -send tessalon  for symptom(s) care of cough  #hyperlipidemia S: Medication: None The 10-year ASCVD risk score (Arnett DK, et al., 2019) is: 6.9%  Lab Results  Component Value Date   CHOL 208 (H) 12/30/2023   HDL 84.40 12/30/2023   LDLCALC 111 (H) 12/30/2023   TRIG 64.0 12/30/2023   CHOLHDL 2 12/30/2023   A/P: Lipids mildly elevated-ASCVD risk below 7.5%-discussed CT calcium scoring if risk elevates    #overweight- screen for diabetes and work on weight loss  Recommended follow up: Return in about 1 year (around 01/01/2026) for followup or sooner if needed.Schedule b4 you leave.  Lab/Order associations: fasting   ICD-10-CM   1. Mild hyperlipidemia  E78.5 Lipid panel    CBC with Differential/Platelet    Comprehensive metabolic panel    2. Screening for diabetes mellitus  Z13.1 HgB A1c    3. Overweight  E66.3 HgB A1c    4. Acute cough  R05.1 POCT Influenza A/B    POC COVID-19    5. Sneezing  R06.7 POCT Influenza A/B    POC COVID-19    6. Screen for colon cancer  Z12.11 Ambulatory referral to Gastroenterology     No orders of the defined types were placed in this encounter.   Return precautions advised.  Garnette Lukes, MD      [1] No Known Allergies  "

## 2025-01-01 NOTE — Patient Instructions (Addendum)
 Please stop by lab before you go If you have mychart- we will send your results within 3 business days of us  receiving them.  If you do not have mychart- we will call you about results within 5 business days of us  receiving them.  *please also note that you will see labs on mychart as soon as they post. I will later go in and write notes on them- will say notes from Dr. Katrinka   No changes today unless labs lead us  to make changes  Tetanus, Diphtheria, and Pertussis (Tdap) at pharmacy  [facility] contact- referred again today Please call to schedule visit and/or procedure IF you do not hear within a week Address: [address] [place], [place], [place] [address] Phone: [phone]   Flu negative. COVID pending but looks negative so far. Likely viral upper respiratory infection (URI) and will slowly resolve over another week or so- if new or worsening symptom(s) please see us  back -try tessalon  for cough  Recommended follow up: Return in about 1 year (around 01/01/2026) for followup or sooner if needed.Schedule b4 you leave.

## 2026-01-02 ENCOUNTER — Ambulatory Visit: Admitting: Family Medicine
